# Patient Record
Sex: Female | Born: 1985 | Race: White | Hispanic: No | Marital: Married | Smoking: Current some day smoker
Health system: Southern US, Community
[De-identification: ages and names within clinical notes are randomized; demographics above are authoritative.]

## PROBLEM LIST (undated history)

## (undated) DIAGNOSIS — F32A Depression, unspecified: Secondary | ICD-10-CM

## (undated) DIAGNOSIS — R112 Nausea with vomiting, unspecified: Secondary | ICD-10-CM

## (undated) DIAGNOSIS — F329 Major depressive disorder, single episode, unspecified: Secondary | ICD-10-CM

## (undated) DIAGNOSIS — J45909 Unspecified asthma, uncomplicated: Secondary | ICD-10-CM

## (undated) DIAGNOSIS — F410 Panic disorder [episodic paroxysmal anxiety] without agoraphobia: Secondary | ICD-10-CM

## (undated) DIAGNOSIS — Z9889 Other specified postprocedural states: Secondary | ICD-10-CM

## (undated) DIAGNOSIS — K219 Gastro-esophageal reflux disease without esophagitis: Secondary | ICD-10-CM

## (undated) DIAGNOSIS — R51 Headache: Secondary | ICD-10-CM

## (undated) DIAGNOSIS — H4710 Unspecified papilledema: Secondary | ICD-10-CM

## (undated) DIAGNOSIS — I10 Essential (primary) hypertension: Secondary | ICD-10-CM

## (undated) DIAGNOSIS — F419 Anxiety disorder, unspecified: Secondary | ICD-10-CM

## (undated) DIAGNOSIS — R519 Headache, unspecified: Secondary | ICD-10-CM

## (undated) HISTORY — PX: TUBAL LIGATION: SHX77

## (undated) HISTORY — DX: Unspecified papilledema: H47.10

## (undated) HISTORY — DX: Panic disorder (episodic paroxysmal anxiety): F41.0

## (undated) HISTORY — DX: Depression, unspecified: F32.A

## (undated) HISTORY — DX: Essential (primary) hypertension: I10

## (undated) HISTORY — DX: Major depressive disorder, single episode, unspecified: F32.9

## (undated) HISTORY — DX: Headache: R51

## (undated) HISTORY — DX: Headache, unspecified: R51.9

## (undated) HISTORY — DX: Anxiety disorder, unspecified: F41.9

## (undated) HISTORY — DX: Gastro-esophageal reflux disease without esophagitis: K21.9

---
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
## 2006-04-05 IMAGING — US US OB DETAIL+14 WK
1 series · 13 of 28 positions shown · non-contrast
Comparison: none

CLINICAL DATA: 19 week 1 day assigned gestational age by early ultrasound.  Increased risk for Trisomy 18 by serum screening.

[Series 1: us ob detail+14 wk · 0.37mm/px · 13 of 67 slices shown]
[im 3/67]
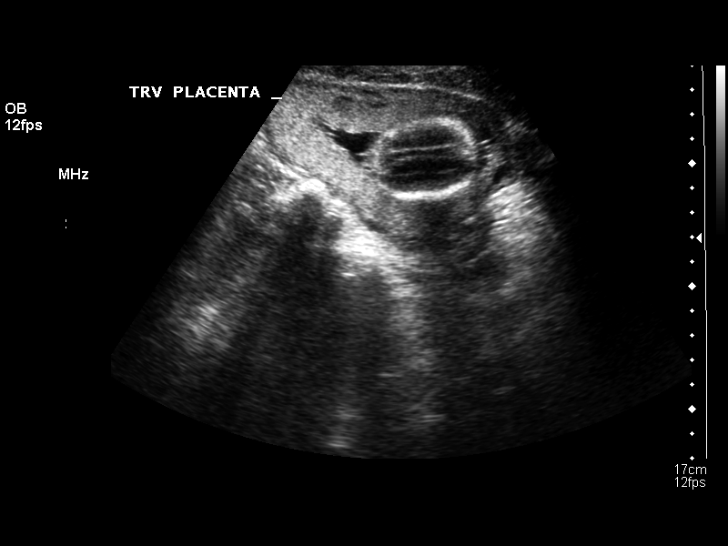
[im 8/67]
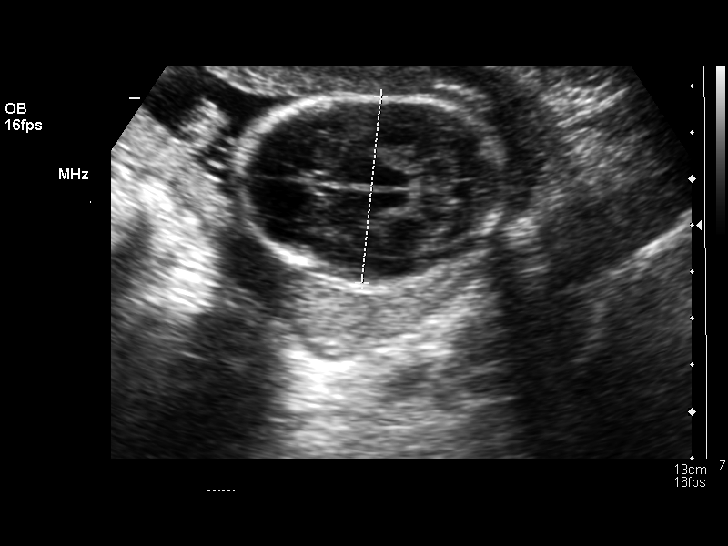
[im 13/67]
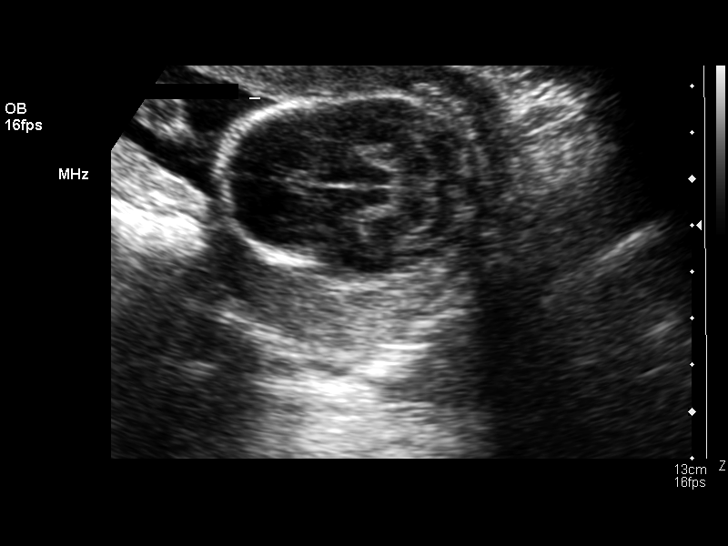
[im 18/67]
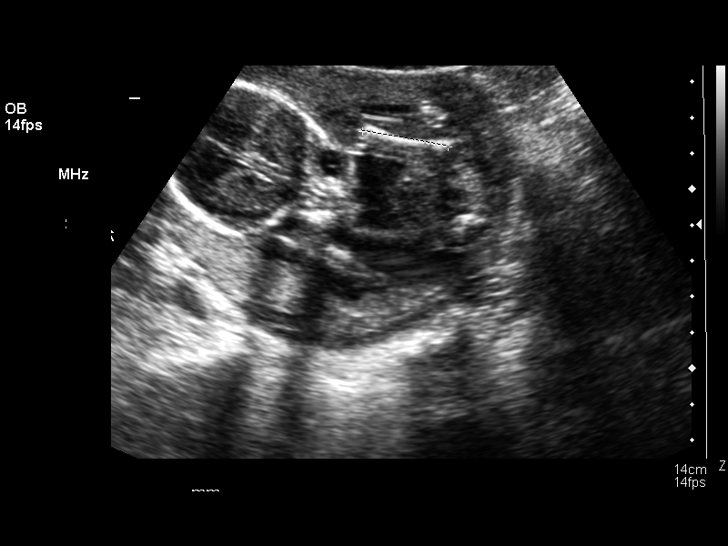
[im 23/67]
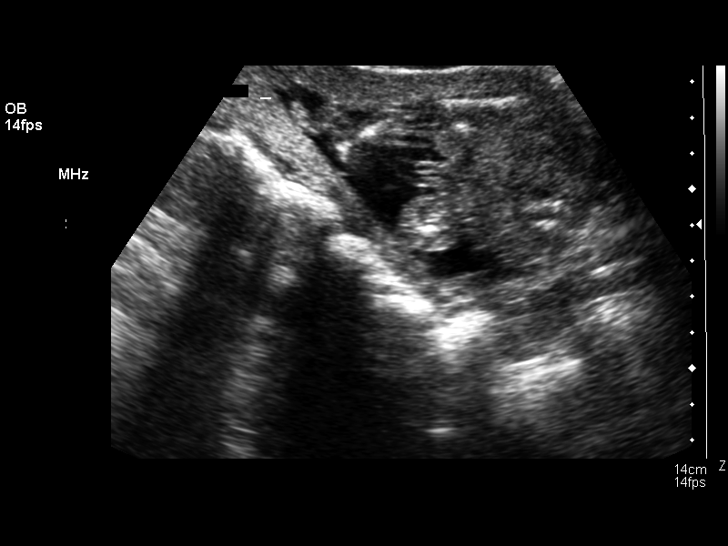
[im 27/67]
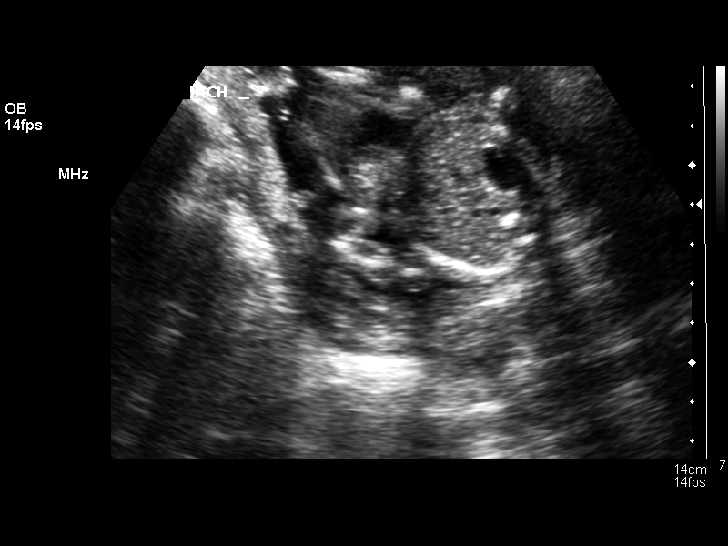
[im 35/67]
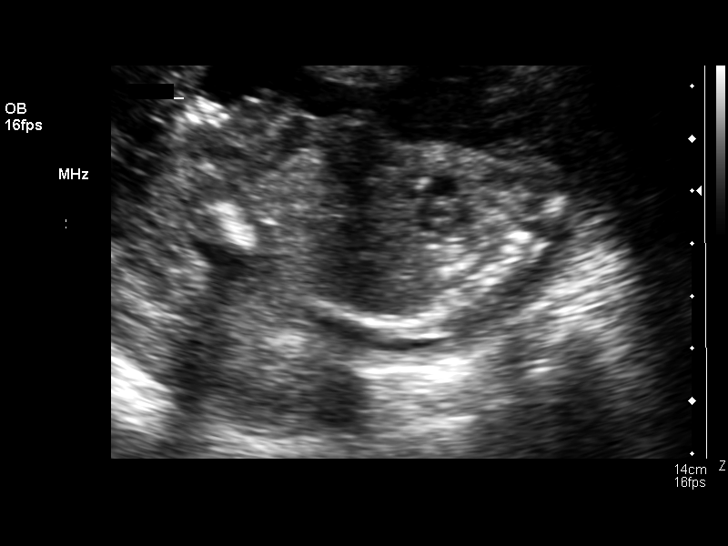
[im 40/67]
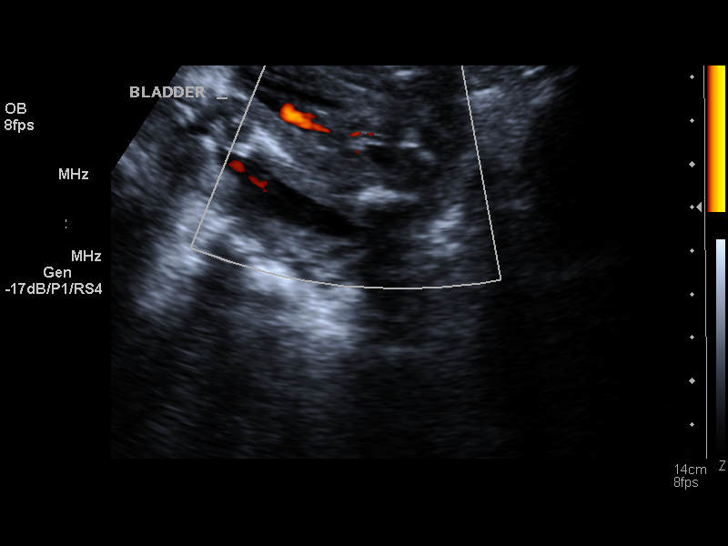
[im 45/67]
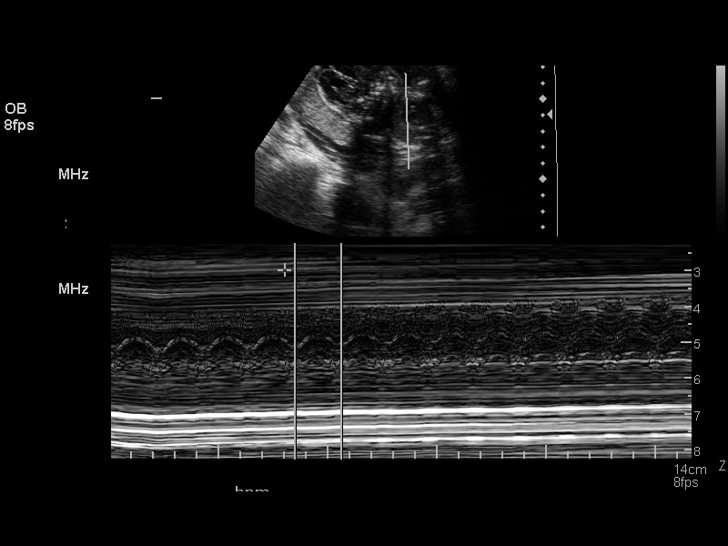
[im 49/67]
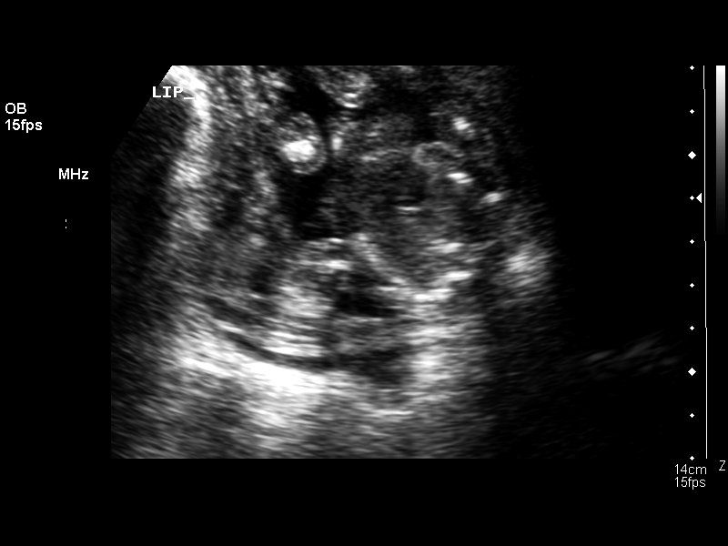
[im 54/67]
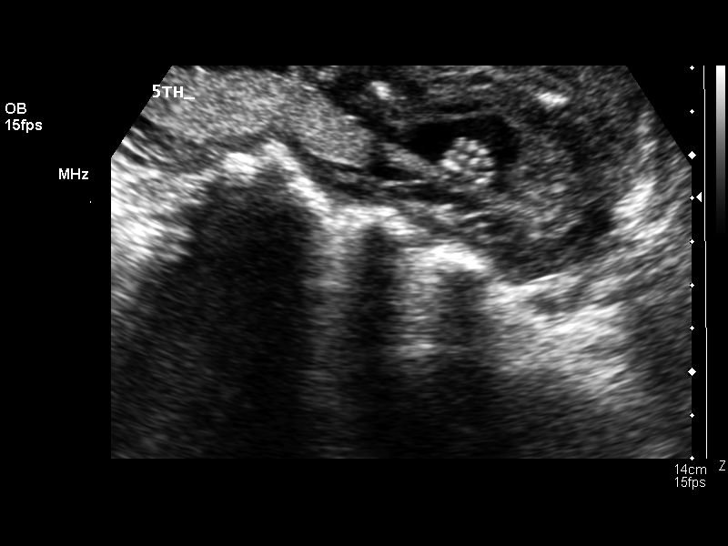
[im 59/67]
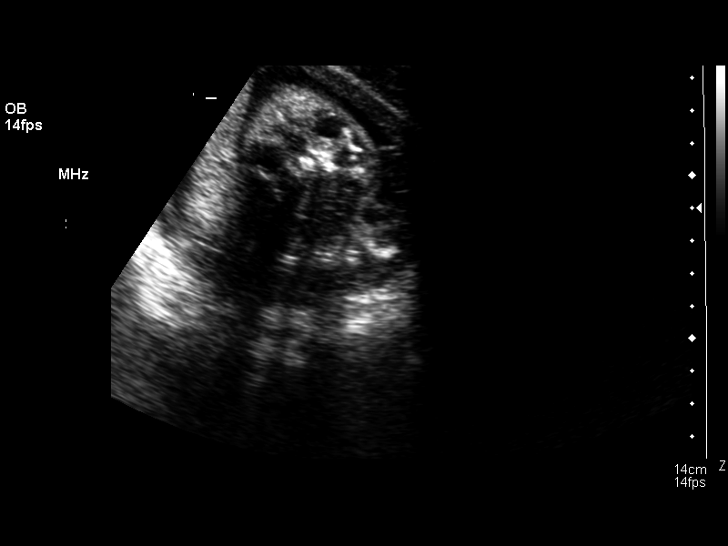
[im 64/67]
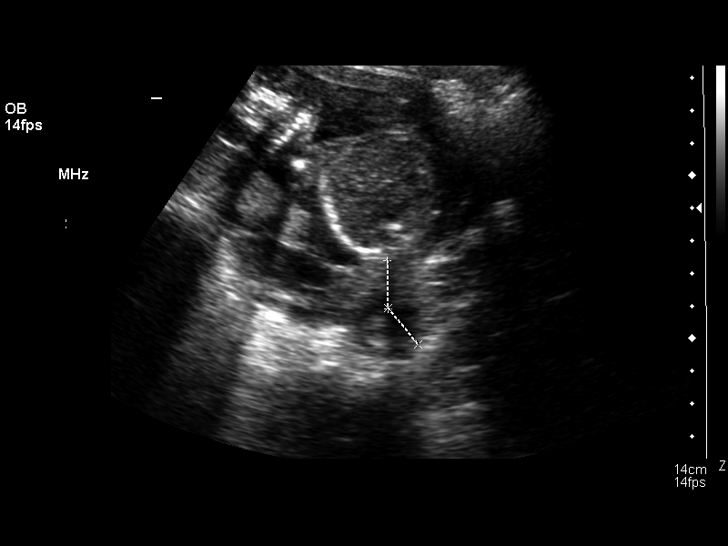

[13 of 28 positions shown; findings below may reference images not displayed]

DETAILED OBSTETRICAL ULTRASOUND:

Number of Fetuses:  1
Heart Rate:  141
Movement:  Yes
Breathing:  No
Presentation:  Breech
Placental Location:  Fundal
Grade:  I
Previa:  No
Amniotic Fluid (Subjective):  Decreased
Amniotic Fluid (Objective):  1.8 cm Vertical pocket 

FETAL BIOMETRY
BPD:  4.0 cm   18 w 2 d
HC:  15.9 cm   18 w 5 d
AC:  13.2 cm  18 w 5 d
FL:  2.5 cm   17 w 3 d
HL:  2.7 cm   18 w 3 d

MEAN GA:  18 w 2 d

FETAL ANATOMY
Lateral Ventricles:  ilated, measuring approximately 1.1 cm. 
Thalami/CSP:  Visualized  
Posterior Fossa:  Visualized 
Nuchal Region:  Visualized 
Spine:  Not visualized 
4 Chamber Heart on Left:  Visualized 
Stomach on Left:  Visualized 
3 Vessel Cord:  Visualized 
Cord Insertion Site:  Visualized 
Kidneys:  Visualized 
Bladder:  Visualized 
Extremities:  Visualized 

ADDITIONAL ANATOMY VISUALIZED:  LVOT, RVOT, upper lip, orbits, diaphragm, heel, and male genitalia.  
Comment:  In addition to dilated lateral cerebral ventricles, an echogenic intracardiac focus is noted within the fetal left ventricle.  A nasal bone is visualized and no other congenital abnormalities are seen.  
Evaluation limited by:  Fetal position and low amniotic fluid volume.  
MATERNAL UTERINE AND ADNEXAL FINDINGS
Cervix:  3.0 cm Transabdominally
IMPRESSION: 1.  Assigned gestational age by prior ultrasound is currently 19 weeks 1 day.  Today?s measurements average 18 weeks 2 day which is approximately 1 week behind and raises the possibility of early IUGR related to aneuploidy.  
2.  Dilated lateral cerebral ventricles noted, as well as echogenic intracardiac focus in the fetal left ventricle.  Decreased amniotic fluid volume also demonstrated.  These findings are nonspecific, but may be seen with Trisomy 18.  Aqueductal stenosis is also a differential consideration.  Amniocentesis is recommended.  This was discussed with the patient and her mother, as well as with Dr. Don Lolito.  

</u12:p>

## 2010-09-07 ENCOUNTER — Encounter: Payer: Self-pay | Admitting: Family Medicine

## 2010-09-08 ENCOUNTER — Encounter: Payer: Self-pay | Admitting: Family Medicine

## 2010-09-08 ENCOUNTER — Encounter: Payer: Self-pay | Admitting: *Deleted

## 2013-11-21 ENCOUNTER — Ambulatory Visit: Payer: Self-pay

## 2013-11-21 ENCOUNTER — Ambulatory Visit: Payer: BC Managed Care – PPO | Attending: Family Medicine

## 2013-11-21 DIAGNOSIS — J45909 Unspecified asthma, uncomplicated: Secondary | ICD-10-CM | POA: Diagnosis not present

## 2013-11-21 DIAGNOSIS — M546 Pain in thoracic spine: Secondary | ICD-10-CM | POA: Insufficient documentation

## 2013-11-21 DIAGNOSIS — R293 Abnormal posture: Secondary | ICD-10-CM | POA: Diagnosis not present

## 2013-11-21 DIAGNOSIS — IMO0001 Reserved for inherently not codable concepts without codable children: Secondary | ICD-10-CM | POA: Diagnosis present

## 2013-11-21 DIAGNOSIS — M6281 Muscle weakness (generalized): Secondary | ICD-10-CM | POA: Insufficient documentation

## 2013-11-23 ENCOUNTER — Ambulatory Visit: Payer: BC Managed Care – PPO | Admitting: Physical Therapy

## 2013-11-23 DIAGNOSIS — IMO0001 Reserved for inherently not codable concepts without codable children: Secondary | ICD-10-CM | POA: Diagnosis not present

## 2013-11-28 ENCOUNTER — Ambulatory Visit: Payer: BC Managed Care – PPO | Admitting: Physical Therapy

## 2013-11-28 DIAGNOSIS — IMO0001 Reserved for inherently not codable concepts without codable children: Secondary | ICD-10-CM | POA: Diagnosis not present

## 2013-11-30 ENCOUNTER — Ambulatory Visit: Payer: BC Managed Care – PPO | Admitting: Physical Therapy

## 2013-11-30 DIAGNOSIS — IMO0001 Reserved for inherently not codable concepts without codable children: Secondary | ICD-10-CM | POA: Diagnosis not present

## 2013-12-05 ENCOUNTER — Ambulatory Visit: Payer: BC Managed Care – PPO | Admitting: Physical Therapy

## 2013-12-05 DIAGNOSIS — IMO0001 Reserved for inherently not codable concepts without codable children: Secondary | ICD-10-CM | POA: Diagnosis not present

## 2013-12-07 ENCOUNTER — Ambulatory Visit: Payer: BC Managed Care – PPO | Admitting: Physical Therapy

## 2013-12-07 DIAGNOSIS — IMO0001 Reserved for inherently not codable concepts without codable children: Secondary | ICD-10-CM | POA: Diagnosis not present

## 2013-12-12 ENCOUNTER — Ambulatory Visit: Payer: BC Managed Care – PPO | Admitting: Physical Therapy

## 2013-12-12 DIAGNOSIS — IMO0001 Reserved for inherently not codable concepts without codable children: Secondary | ICD-10-CM | POA: Diagnosis not present

## 2013-12-14 ENCOUNTER — Ambulatory Visit: Payer: BC Managed Care – PPO | Admitting: Physical Therapy

## 2013-12-14 DIAGNOSIS — IMO0001 Reserved for inherently not codable concepts without codable children: Secondary | ICD-10-CM | POA: Diagnosis not present

## 2014-03-09 ENCOUNTER — Ambulatory Visit (INDEPENDENT_AMBULATORY_CARE_PROVIDER_SITE_OTHER): Payer: BC Managed Care – PPO | Admitting: Neurology

## 2014-03-09 ENCOUNTER — Encounter: Payer: Self-pay | Admitting: Neurology

## 2014-03-09 VITALS — BP 115/78 | HR 73 | Ht <= 58 in | Wt 154.4 lb

## 2014-03-09 DIAGNOSIS — G932 Benign intracranial hypertension: Secondary | ICD-10-CM | POA: Insufficient documentation

## 2014-03-09 DIAGNOSIS — H4710 Unspecified papilledema: Secondary | ICD-10-CM

## 2014-03-09 DIAGNOSIS — E669 Obesity, unspecified: Secondary | ICD-10-CM | POA: Insufficient documentation

## 2014-03-09 DIAGNOSIS — R51 Headache: Secondary | ICD-10-CM

## 2014-03-09 DIAGNOSIS — R519 Headache, unspecified: Secondary | ICD-10-CM | POA: Insufficient documentation

## 2014-03-09 MED ORDER — ACETAZOLAMIDE 125 MG PO TABS: 250.0000 mg | ORAL_TABLET | Freq: Two times a day (BID) | ORAL | Status: DC

## 2014-03-09 MED ORDER — TOPIRAMATE 25 MG PO TABS: 25.0000 mg | ORAL_TABLET | Freq: Every day | ORAL | Status: DC

## 2014-03-09 NOTE — Patient Instructions (Addendum)
I had a long discussion with the patient with regards to her bilateral optic nerve disc swelling and discuss pseudotumor cerebri likely been triggered by her significant recent weight gain and starting birth control pills. I recommend she discontinue birth control pills and diet, exercise, lifestyle change and lose weight. Start Topamax 25 mg twice daily as well. We will not use Diamox because of her allergy to sulfa. Check MRI scan of the brain with MRV and antiphospholipid antibodies, ANA and ESR. Return followup in 2 months or earlier if necessary  Pseudotumor Cerebri Pseudotumor cerebri, also called idiopathic intracranial hypertension, is a condition that occurs due to increased pressure within your skull. Although some of the symptoms resemble those of a brain tumor, it is not a brain tumor. Symptoms occur when the increased pressure in your skull compresses brain structures. For example, pressure on the nerve responsible for vision (optic nerve) causes it to swell, resulting in visual symptoms. Pseudotumor cerebri tends to occur in obese women younger than 27 years of age. However, men and children can also develop this condition. SYMPTOMS  Symptoms of pseudotumor cerebri occur due to increased pressure within the skull. Symptoms may include:   Headaches.  Nausea and vomiting.  Dizziness.  High blood pressure.   Ringing in the ears.  Double or blurred vision.  Brief episodes of complete loss of vision.  Pain in the back, neck, or shoulders. DIAGNOSIS  Pseudotumor cerebri is diagnosed through:  A detailed eye exam, which can reveal a swollen optic nerve, as well as identifying issues such as blind spots in the vision.  An MRI or CT scan to rule out other disorders that can cause similar symptoms, such as brain tumors.  A spinal tap (lumbar puncture), which can demonstrate increased pressure within the skull. TREATMENT  There are several ways that pseudotumor cerebri is  treated, including:  Medicines to decrease the production of spinal fluid and lower the pressure within your skull.  Medicines to prevent or treat headaches.  Surgery to create an opening in your optic nerve to allow excess fluid to drain out.  Surgery to place drains (shunts) in your brain to remove excess fluid. HOME CARE INSTRUCTIONS   Take all medicines as directed by your health care provider.  Go to all of your follow-up appointments.  Lose weight if you are overweight. SEEK MEDICAL CARE IF:  Any symptoms come back.  You develop trouble with hearing, vision, balance, or your sense of smell.  You cannot eat or drink what you need.  You are more weak or tired than usual.   You are losing weight without trying. SEEK IMMEDIATE MEDICAL CARE IF:  You have new symptoms such as vision problems or difficulty walking.   You have a seizure.   You have trouble breathing.   You have a fever.  Document Released: 08/09/2013 Document Reviewed: 08/09/2013 [facility] Patient Information 2015 [place], [place]. This information is not intended to replace advice given to you by your health care provider. Make sure you discuss any questions you have with your health care provider.

## 2014-03-09 NOTE — Progress Notes (Signed)
[facility] [address] [place]. [place] [address] [phone]       OFFICE CONSULT NOTE  Judy Taylor Date of Birth:  1986-02-13 Medical Record Number:  [id number]   Referring MD: Anthony Sar  Reason for Referral:  Papilloedema  HPI: 64 year young lady to notice headache behind her right eye at about 10 days ago. This lasted for about a week and was constant and moderate 7/10 in severity. She took ibuprofen which would releive headache for several hours. There was no accompanying nausea, vomiting and only mild light sensitivity. She did not notice any diminished vision or pain on eye movements. She saw optometrist  Dr. 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 Patient admits to have gained 40 pounds weight last 1 year. She has also been started on birth control pills 6 weeks ago. She denies any double vision, loss of vision, gait balance problems. She has no prior history of migraine headaches or significant neurological problems. 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 She had no tick bite or cat scratch. She is otherwise quite healthy. She is here 2 young children. She is currently a housewife. Chief does not take excess vitamins and started B. Vitamins just a week ago. She is a high school graduate and plans to go to college and study  ROS:   14 system review of systems is positive for  Headache, eye pain, anxiety, insomnia, skin sensitivity and all other systems negative  PMH:  Past Medical History  Diagnosis Date  . Depression     Social History:  History   Social History  . Marital Status: Married    Spouse Name: N/A    Number of Children: 2  . Years of Education: 12th   Occupational History  . N/A    Social History Main Topics  . Smoking status: Current Every Day Smoker  . Smokeless tobacco: Not on file  . Alcohol Use: No  . Drug Use: No  . Sexual Activity: Yes    Other Topics Concern  . Not on file   Social History Narrative   Patient lives at home with her family    Patient is right handed   Patient drinks soda    Medications:   No current outpatient prescriptions on file prior to visit.   No current facility-administered medications on file prior to visit.    Allergies:   Allergies  Allergen Reactions  . Bactrim [Sulfamethoxazole-Tmp Ds] Rash    Physical Exam General: mildly obese young lady, seated, in no evident distress Head: head normocephalic and atraumatic. Orohparynx benign Neck: supple with no carotid or supraclavicular bruits Cardiovascular: regular rate and rhythm, no murmurs Musculoskeletal: no deformity Skin:  no rash/petichiae Vascular:  Normal pulses all extremities Filed Vitals:   03/09/14 0859  BP: 115/78  Pulse: 73    Neurologic Exam Mental Status: Awake and fully alert. Oriented to place and time. Recent and remote memory intact. Attention span, concentration and fund of knowledge appropriate. Mood and affect appropriate.  Cranial Nerves: Fundoscopic exam reveals mild blurred disc margins in nasal inferior and superior quadrants with preserved sharp temporal margins. No peripapillary hemorrhages or exudates are noted. Venous pulsations are sluggish  .vision acuity is normal. Pupils equal, briskly reactive to light. Extraocular movements full without nystagmus. Visual fields full to confrontation. Hearing intact. Facial sensation intact. Face, tongue, palate moves normally and symmetrically.  Motor: Normal bulk and tone. Normal strength in all tested extremity muscles. Sensory.: intact to tough and pinprick and vibratory.  Coordination: Rapid alternating movements normal in all extremities. Finger-to-nose and heel-to-shin performed accurately bilaterally. Gait and Station: Arises from chair without difficulty. Stance is normal. Gait demonstrates normal stride length and balance . Able to heel, toe and tandem  walk without difficulty.  Reflexes: 1+ and symmetric. Toes downgoing.       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 I recommend she discontinue birth control pills and diet, exercise, lifestyle change and lose weight. Start Topamax 25 mg  twice daily as well. We will not use Diamox because of her allergy to sulfa. Check MRI scan of the brain with MRV and antiphospholipid antibodies, ANA and ESR.Since she is not having significant headaches at the present time we'll hold off on spinal tap unless she has worsening vision symptoms or papilledema upon followup. Return followup in 2 months or earlier if necessary  Note: This document was prepared with digital dictation and possible smart phrase technology. Any transcriptional errors that result from this process are unintentional.

## 2014-03-15 LAB — COAG STUDIES INTERP REPORT: PDF Image: 0

## 2014-03-15 LAB — ANTIPHOSPHOLIPID SYNDROME EVAL, BLD
APTT PPP: 28.6 s (ref 23.4–36.4)
Anticardiolipin IgG: <9 GPL U/mL (ref 0–14)
Anticardiolipin IgM: 10 MPL U/mL (ref 0–12)
Beta-2 Glyco 1 IgM: <9 GPI IgM units (ref 0–32)
Beta-2 Glyco I IgG: <9 GPI IgG units (ref 0–20)
Dilute Viper Venom Time: 30.4 s (ref 0.0–55.1)
INR: 0.9 (ref 0.8–1.2)
PT: 12.4 s (ref 11.9–14.1)
Thrombin Time: 14.8 s (ref 0.0–20.0)

## 2014-03-15 LAB — SEDIMENTATION RATE: SED RATE: 11 mm/h (ref 0–32)

## 2014-03-15 LAB — ANA W/REFLEX IF POSITIVE: ANA: NEGATIVE

## 2014-03-16 ENCOUNTER — Telehealth: Payer: Self-pay | Admitting: Neurology

## 2014-03-16 NOTE — Telephone Encounter (Signed)
OV Note says: We will not use Diamox because of her allergy to sulfa. I called the patient back.  Relayed info from OV note.  She verbalized understanding and will call us back if anything further is needed.

## 2014-03-16 NOTE — Telephone Encounter (Signed)
Patient stated Rx for ACETAZOLIDE is too expensive.  Questioning an alternative medication.  Please call anytime and can leave message if not available.  Thanks

## 2014-03-20 ENCOUNTER — Telehealth: Payer: Self-pay | Admitting: Neurology

## 2014-03-20 NOTE — Telephone Encounter (Signed)
Patient calling to request bloodwork results, please return call to patient and advise.

## 2014-03-20 NOTE — Telephone Encounter (Signed)
Called patient and with advise from Dr.Sethi the patient blood work was normal nothing to worry about

## 2014-03-30 ENCOUNTER — Ambulatory Visit (INDEPENDENT_AMBULATORY_CARE_PROVIDER_SITE_OTHER): Payer: BC Managed Care – PPO

## 2014-03-30 DIAGNOSIS — H4710 Unspecified papilledema: Secondary | ICD-10-CM

## 2014-03-30 MED ORDER — GADOPENTETATE DIMEGLUMINE 469.01 MG/ML IV SOLN: 14.0000 mL | Freq: Once | INTRAVENOUS | Status: AC | PRN

## 2014-03-31 ENCOUNTER — Telehealth: Payer: Self-pay | Admitting: Neurology

## 2014-03-31 NOTE — Telephone Encounter (Signed)
Patient calling to request MRI results, please call patient and advise.  °

## 2014-03-31 NOTE — Telephone Encounter (Signed)
Called patient to let her know that it takes at least a week for results to be read,  She just had them done yesrterday.

## 2014-04-03 ENCOUNTER — Telehealth: Payer: Self-pay | Admitting: Neurology

## 2014-04-03 NOTE — Telephone Encounter (Signed)
Patient requesting MRI results, please call back and advise.

## 2014-04-03 NOTE — Telephone Encounter (Signed)
Called patient and lvm letting her know her results are not ready. When there done we will give her a call with results.

## 2014-04-06 ENCOUNTER — Telehealth: Payer: Self-pay | Admitting: *Deleted

## 2014-04-07 NOTE — Telephone Encounter (Signed)
Called patient and gave her normal results for both mri's per Dr. Pearlean BrownieSethi. I also asked her to call if she has any problems or concerns to be seen sooner.

## 2014-06-30 ENCOUNTER — Ambulatory Visit (INDEPENDENT_AMBULATORY_CARE_PROVIDER_SITE_OTHER): Payer: BC Managed Care – PPO | Admitting: Neurology

## 2014-06-30 ENCOUNTER — Encounter: Payer: Self-pay | Admitting: Neurology

## 2014-06-30 VITALS — BP 120/86 | HR 66 | Ht 59.0 in | Wt 151.0 lb

## 2014-06-30 DIAGNOSIS — R519 Headache, unspecified: Secondary | ICD-10-CM

## 2014-06-30 DIAGNOSIS — R51 Headache: Secondary | ICD-10-CM

## 2014-06-30 MED ORDER — TOPIRAMATE 50 MG PO TABS: 50.0000 mg | ORAL_TABLET | Freq: Every day | ORAL | Status: DC

## 2014-06-30 NOTE — Progress Notes (Signed)
[facility] [address] [place]. [place] [address] [phone]       OFFICE FOLLOW UP VISIT NOTE  [name] Date of Birth:  Oct 12, 1985 Medical Record Number:  [id number]   Referring MD: Judy Taylor  Reason for Referral:  Papilloedema  HPI: 24 year young lady to notice headache behind her right eye at about 10 days ago. This lasted for about a week and was constant and moderate 7/10 in severity. She took ibuprofen which would releive headache for several hours. There was no accompanying nausea, vomiting and only mild light sensitivity. She did not notice any diminished vision or pain on eye movements. She saw optometrist  Dr. 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 Patient admits to have gained 40 pounds weight last 1 year. She has also been started on birth control pills 6 weeks ago. She denies any double vision, loss of vision, gait balance problems. She has no prior history of migraine headaches or significant neurological problems. 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 She had no tick bite or cat scratch. She is otherwise quite healthy. She is here 2 young children. She is currently a housewife. Chief does not take excess vitamins and started B. Vitamins just a week ago. She is a high school graduate and plans to go to college and study Update 06/30/2014 : She returns for follow-up after last visit 3 months ago. She reports some improvement in her headaches on Topamax but when he ran out she notices the headaches aren't worse. She is still taking only 25 mg at night and tolerating it well. She had lost about 14 pounds but unfortunately after the death of her grandmother whom she was quite attached to she has gained it all back. She reports a pressure-like sensation behind her eyes with intermittent sharp shooting pain going to the back. This seems to be more increased when she sneezing or  coughing. She has discontinued birth control pills and has now been using copper IUD. She underwent MRI scan of the brain and MRV on 03/30/14 which have both personally reviewed and are normal. Lab work on 03/09/14 shows normal ESR, ANA panel, anticardiolipin antibodies patient denies any blurred vision or loss of vision. She has not yet seen her optometrist for follow-up.she is again today not keen to do a diagnostic spinal tap to confirm diagnosis of pseudotumor cerebri ROS:   14 system review of systems is positive for  Headache, eye pain, anxiety, insomnia, skin sensitivity and all other systems negative  PMH:  Past Medical History  Diagnosis Date  . Depression     Social History:  History   Social History  . Marital Status: Married    Spouse Name: N/A    Number of Children: 2  . Years of Education: 12th   Occupational History  . N/A    Social History Main Topics  . Smoking status: Current Every Day Smoker  . Smokeless tobacco: Never Used  . Alcohol Use: No  . Drug Use: No  . Sexual Activity: Yes   Other Topics Concern  . Not on file   Social History Narrative   Patient is married with 2 children.   Patient is right handed   Patient has hs education.   Patient drinks around 5 sodas daily.    Medications:   Current Outpatient Prescriptions on File Prior to Visit  Medication Sig  Dispense Refill  . LORazepam (ATIVAN) 1 MG tablet Take 1 mg by mouth as needed.     No current facility-administered medications on file prior to visit.    Allergies:   Allergies  Allergen Reactions  . Bactrim [Sulfamethoxazole-Trimethoprim] Rash    Physical Exam General: mildly obese young lady, seated, in no evident distress Head: head normocephalic and atraumatic. Orohparynx benign Neck: supple with no carotid or supraclavicular bruits Cardiovascular: regular rate and rhythm, no murmurs Musculoskeletal: no deformity Skin:  no rash/petichiae Vascular:  Normal pulses all  extremities Filed Vitals:   06/30/14 1026  BP: 120/86  Pulse: 66    Neurologic Exam Mental Status: Awake and fully alert. Oriented to place and time. Recent and remote memory intact. Attention span, concentration and fund of knowledge appropriate. Mood and affect appropriate.  Cranial Nerves: Fundoscopic exam reveals mild blurred disc margins in nasal inferior and superior quadrants with preserved sharp temporal margins. No peripapillary hemorrhages or exudates are noted. Venous pulsations are sluggish  .vision acuity is normal. Pupils equal, briskly reactive to light. Extraocular movements full without nystagmus. Visual fields full to confrontation. Hearing intact. Facial sensation intact. Face, tongue, palate moves normally and symmetrically.  Motor: Normal bulk and tone. Normal strength in all tested extremity muscles. Sensory.: intact to tough and pinprick and vibratory.  Coordination: Rapid alternating movements normal in all extremities. Finger-to-nose and heel-to-shin performed accurately bilaterally. Gait and Station: Arises from chair without difficulty. Stance is normal. Gait demonstrates normal stride length and balance . Able to heel, toe and tandem walk without difficulty.  Reflexes: 1+ and symmetric. Toes downgoing.       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 I recommend she increase her Topamax to 50 mg at night for a week or 2 and if needed increase further to twice daily. I have again counseled her to diet, exercise regularly and lose weight. I also encouraged her to  keep make a follow-up appointment with her optometrist to further categorize her optic disc swelling. She was again strongly counseled to make an appointment to do a diagnostic spinal tap to confirm the diagnosis of pseudotumor. She will think about it and let us know. Note: This document was prepared with digital dictation and possible smart phrase technology. Any transcriptional errors that result from this process are unintentional.

## 2014-06-30 NOTE — Patient Instructions (Signed)
I had a long discussion with the patient with regards to her headache, optic disc swelling and personally reviewed and discussed the findings of MRI scan, MRV and lab results with the patient and answered questions. I recommend she increase her Topamax to 50 mg at night for a week or 2 and if needed increase further to twice daily. I have again counseled her to diet, exercise regularly and lose weight. I also encouraged her to keep make a follow-up appointment with her optometrist to further categorize her optic disc swelling. She was again strongly counseled to make an appointment to do a diagnostic spinal tap to confirm the diagnosis of pseudotumor. She will think about it and let us know.

## 2014-09-27 ENCOUNTER — Telehealth: Payer: Self-pay | Admitting: Neurology

## 2014-09-27 MED ORDER — TOPIRAMATE 50 MG PO TABS: 50.0000 mg | ORAL_TABLET | Freq: Two times a day (BID) | ORAL | Status: DC

## 2014-09-27 NOTE — Telephone Encounter (Signed)
Pt is calling requesting a refill on topiramate (TOPAMAX) 50 MG tablet and it needs to be increased to 100mg .  [facility] [place]  Please call and advise. Pt states she needs to come in for Lumbar puncture but she is waiting on funds to make that appointment.

## 2014-09-27 NOTE — Telephone Encounter (Signed)
Last OV note says: I recommend she increase her Topamax to 50 mg at night for a week or 2 and if needed increase further to twice daily. Rx has been sent.  I called back, got no answer.  Left message.

## 2015-01-03 ENCOUNTER — Telehealth: Payer: Self-pay | Admitting: Neurology

## 2015-01-03 MED ORDER — TOPIRAMATE 50 MG PO TABS: 50.0000 mg | ORAL_TABLET | Freq: Two times a day (BID) | ORAL | Status: DC

## 2015-01-03 NOTE — Telephone Encounter (Signed)
Hi, Judy Taylor: for the topamax, do I need to do anything now? For the LP, did Dr. Pearlean BrownieSethi mention that he wants to do in clinic or he wants to pt to have it under fluoro-guidance? Thanks.  Judy PlanJindong Ronita Hargreaves, MD PhD Stroke Neurology 01/03/2015 2:58 PM

## 2015-01-03 NOTE — Telephone Encounter (Signed)
Rx has been sent.  I called the patient to advise.  She is aware.  

## 2015-01-03 NOTE — Telephone Encounter (Signed)
[facility]  Dr Marlis EdelsonSethi's note does not indicate location for LP if she chose to have it done.

## 2015-01-03 NOTE — Telephone Encounter (Signed)
Because for pseudotumor cerebri, LP has to measure opening pressure. While in fluro-guided LP, they frequently do not check opening pressures, so many neurologists prefer to do it themselves to make sure about the opening pressure issue. I would like to leave this issue to Dr. Pearlean BrownieSethi when he comes back.   Mary, could you please let the pt know that Dr. Pearlean BrownieSethi is out of town and he will contact pt when he comes back. Thanks.   Marvel PlanJindong Shelle Galdamez, MD PhD Stroke Neurology 01/03/2015 3:45 PM

## 2015-01-03 NOTE — Telephone Encounter (Signed)
Patient called requesting a refill for topiramate (TOPAMAX) 50 MG tablet.[facility] [place] [facility] [phone] Patient also requested to be scheduled for a lumbar puncture. Please call and advise. Patient can be reached @ [phone]

## 2015-01-03 NOTE — Telephone Encounter (Signed)
Per Dr Roda ShuttersXu, spoke with patient and informed her that Dr Pearlean BrownieSethi is out of town, returns to this office on 01/10/15. Informed her that Dr Roda ShuttersXu felt Dr Pearlean BrownieSethi should address her request because this procedure is done in this office but may also be done outside this office. She states Dr Pearlean BrownieSethi "told her he would do it in this office." She further stated that she "was fine with waiting until Dr Pearlean BrownieSethi returns to schedule it."  Informed her this request will be routed to Dr Pearlean BrownieSethi for his review when he returns next week. Ms Hart RochesterLawson verbalized understanding, appreciation.

## 2015-01-10 NOTE — Telephone Encounter (Signed)
I called the patient ion her listed number and left message on answering machine to call me back to discuss her headache and LP

## 2015-09-29 ENCOUNTER — Encounter (HOSPITAL_COMMUNITY): Payer: Self-pay | Admitting: *Deleted

## 2015-09-29 DIAGNOSIS — R509 Fever, unspecified: Secondary | ICD-10-CM | POA: Diagnosis present

## 2015-09-29 DIAGNOSIS — J069 Acute upper respiratory infection, unspecified: Secondary | ICD-10-CM | POA: Diagnosis not present

## 2015-09-29 DIAGNOSIS — Z79899 Other long term (current) drug therapy: Secondary | ICD-10-CM | POA: Diagnosis not present

## 2015-09-29 DIAGNOSIS — F17200 Nicotine dependence, unspecified, uncomplicated: Secondary | ICD-10-CM | POA: Diagnosis not present

## 2015-09-29 DIAGNOSIS — J45901 Unspecified asthma with (acute) exacerbation: Secondary | ICD-10-CM | POA: Insufficient documentation

## 2015-09-29 DIAGNOSIS — Z7951 Long term (current) use of inhaled steroids: Secondary | ICD-10-CM | POA: Diagnosis not present

## 2015-09-29 DIAGNOSIS — F329 Major depressive disorder, single episode, unspecified: Secondary | ICD-10-CM | POA: Insufficient documentation

## 2015-09-29 NOTE — ED Notes (Signed)
Pt c/o sore throat, headache, nasal congestion, chills, generalized body aches that started this am,

## 2015-09-30 ENCOUNTER — Emergency Department (HOSPITAL_COMMUNITY)
Admission: EM | Admit: 2015-09-30 | Discharge: 2015-09-30 | Disposition: A | Discharge status: Home / Self Care | Payer: Medicaid Other | Attending: Emergency Medicine | Admitting: Emergency Medicine

## 2015-09-30 ENCOUNTER — Emergency Department (HOSPITAL_COMMUNITY): Payer: Medicaid Other

## 2015-09-30 DIAGNOSIS — J069 Acute upper respiratory infection, unspecified: Secondary | ICD-10-CM

## 2015-09-30 NOTE — ED Notes (Addendum)
Patient wrapped in blanket from home and asleep in waiting room.

## 2015-09-30 NOTE — ED Provider Notes (Signed)
CSN: [id number]     Arrival date & time 09/29/15  2335 History   First MD Initiated Contact with Patient 09/30/15 0119     Chief Complaint  Patient presents with  . Chills     (Consider location/radiation/quality/duration/timing/severity/associated sxs/prior Treatment) HPI  30 year old female presents with a chief complaint of chills. States over the last 2 days she has been having subjective fever with a maximum temperature of 99, chills, cough, congestion, headache, and sore throat. Also having diffuse body aches. The symptoms improved when she takes ibuprofen. Occasionally the cough is productive, she has been taking Mucinex for this. Does feel subjective shortness of breath. No chest pain, abdominal pain, or vomiting. Has had sick contacts with her children who have been at daycare with similar symptoms. Received a flu shot in October. Diagnosed with "mild asthma" that she states has been told is mostly anxiety driven.  Past Medical History  Diagnosis Date  . Depression    Past Surgical History  Procedure Laterality Date  . Cesarean section     Family History  Problem Relation Age of Onset  . High blood pressure Mother   . Heart attack Maternal Grandmother   . Dementia Paternal Grandmother    Social History  Substance Use Topics  . Smoking status: Current Every Day Smoker  . Smokeless tobacco: Never Used  . Alcohol Use: No   OB History    No data available     Review of Systems  Constitutional: Positive for fever and chills.  Respiratory: Positive for cough and shortness of breath.   Cardiovascular: Negative for chest pain.  Gastrointestinal: Negative for vomiting and abdominal pain.  Musculoskeletal: Positive for myalgias. Negative for joint swelling.  All other systems reviewed and are negative.     Allergies  Bactrim  Home Medications   Prior to Admission medications   Medication Sig Start Date End Date Taking? Authorizing Provider  clonazePAM (KLONOPIN)  0.5 MG tablet Take 0.5 mg by mouth 2 (two) times daily.   Yes Historical Provider, MD  fluticasone (FLONASE) 50 MCG/ACT nasal spray Place 1 spray into both nostrils as needed.  05/12/14  Yes Historical Provider, MD  lamoTRIgine (LAMICTAL) 200 MG tablet Take 200 mg by mouth 2 (two) times daily.   Yes Historical Provider, MD  lurasidone (LATUDA) 20 MG TABS tablet Take 20 mg by mouth.   Yes Historical Provider, MD  LORazepam (ATIVAN) 1 MG tablet Take 1 mg by mouth as needed. 01/12/14   Historical Provider, MD  topiramate (TOPAMAX) 50 MG tablet Take 1 tablet (50 mg total) by mouth 2 (two) times daily. 01/03/15 01/03/16  Micki Riley, MD   BP 128/79 mmHg  Pulse 94  Temp(Src) 99 F (37.2 C) (Oral)  Resp 20  Ht  (1.473 m)  Wt 166 lb (75.297 kg)  BMI 34.70 kg/m2  SpO2 100%  LMP 08/29/2015 Physical Exam  Constitutional: She is oriented to person, place, and time. She appears well-developed and well-nourished.  HENT:  Head: Normocephalic and atraumatic.  Right Ear: External ear normal.  Left Ear: External ear normal.  Nose: Nose normal.  Mouth/Throat: Oropharynx is clear and moist. No oropharyngeal exudate.  Eyes: Right eye exhibits no discharge. Left eye exhibits no discharge.  Neck: Normal range of motion. Neck supple.  No meningismus  Cardiovascular: Normal rate, regular rhythm and normal heart sounds.   Pulmonary/Chest: Effort normal [id number] sounds normal. She has no wheezes. She has no rales.  Abdominal: Soft. There is  no tenderness.  Lymphadenopathy:    She has no cervical adenopathy.  Neurological: She is alert and oriented to person, place, and time.  Skin: Skin is warm and dry.  Nursing note and vitals reviewed.   ED Course  Procedures (including critical care time) Labs Review Labs Reviewed - No data to display  Imaging Review Dg Chest 2 View  09/30/2015  CLINICAL DATA:  Acute onset of sore throat, headache and nasal congestion. Chills and body aches. Initial  encounter. EXAM: CHEST  2 VIEW COMPARISON:  None. FINDINGS: The lungs are well-aerated. Mild vascular congestion is noted. There is no evidence of focal opacification, pleural effusion or pneumothorax. The heart is borderline normal in size. No acute osseous abnormalities are seen. IMPRESSION: Mild vascular congestion noted.  Lungs remain grossly clear. Electronically Signed   By: Roanna Raider M.D.   On: 09/30/2015 02:12   I have personally reviewed and evaluated these images and lab results as part of my medical decision-making.   EKG Interpretation None      MDM   Final diagnoses:  Upper respiratory infection    Patient symptoms are consistent with a viral upper restaurant infection. Discussed that this could be influenza but after discussion with patient she would not want to be treated with Tamiflu. She does have a reported history of "mild asthma" but she has not used an inhaler for quite some time and I don't think this is a significant risk factor for possible flu. Plan to treat with continued ibuprofen and Tylenol as well as oral fluids and antitussives. Discussed strict return precautions. Follow-up with PCP.    Pricilla Loveless, MD 09/30/15 [phone]

## 2015-11-20 ENCOUNTER — Telehealth: Payer: Self-pay | Admitting: Neurology

## 2015-11-20 NOTE — Telephone Encounter (Signed)
Rn call patient back wanting a LP. Rn explain according to [facility] notes she was last here in 06/2014. Pt stated she got her dates mix up. Rn explain Dr.Sethi cannot order a LP until he evaluates her. Patient is seeing the eye doctor. Pt states she is in school full time. Pt schedule for May 2017 for appt. Pt verbalized understanding.

## 2015-11-20 NOTE — Telephone Encounter (Signed)
Pt was seen May of 2016 and would like to set up an LP now. It was last discussed during May visit. Please call and advise [phone] cell

## 2015-12-26 ENCOUNTER — Ambulatory Visit: Payer: Self-pay | Admitting: Neurology

## 2015-12-27 ENCOUNTER — Encounter: Payer: Self-pay | Admitting: Neurology

## 2016-03-20 ENCOUNTER — Encounter: Payer: Self-pay | Admitting: Neurology

## 2016-03-20 ENCOUNTER — Ambulatory Visit (INDEPENDENT_AMBULATORY_CARE_PROVIDER_SITE_OTHER): Payer: Medicaid Other | Admitting: Neurology

## 2016-03-20 VITALS — BP 131/84 | HR 97 | Ht <= 58 in | Wt 171.0 lb

## 2016-03-20 DIAGNOSIS — G932 Benign intracranial hypertension: Secondary | ICD-10-CM

## 2016-03-20 MED ORDER — TOPIRAMATE 25 MG PO TABS: 25.0000 mg | ORAL_TABLET | Freq: Two times a day (BID) | ORAL | 3 refills | Status: DC

## 2016-03-20 MED ORDER — TOPIRAMATE 50 MG PO TABS: 50.0000 mg | ORAL_TABLET | Freq: Two times a day (BID) | ORAL | 2 refills | Status: DC

## 2016-03-20 NOTE — Progress Notes (Signed)
[facility] [address] [place] [place] [address] [phone]       OFFICE FOLLOW UP VISIT NOTE  [name] Date of Birth:  [birth year] Medical Record Number:  [id number]   Referring MD: Anthony Sar  Reason for Referral:  Papilloedema  HPI: 12 year young lady to notice headache behind her right eye at about 10 days ago. This lasted for about a week and was constant and moderate 7/10 in severity. She took ibuprofen which would releive headache for several hours. There was no accompanying nausea, vomiting and only mild light sensitivity. She did not notice any diminished vision or pain on eye movements. She saw optometrist  Dr. 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 Patient admits to have gained 40 pounds weight last 1 year. She has also been started on birth control pills 6 weeks ago. She denies any double vision, loss of vision, gait balance problems. She has no prior history of migraine headaches or significant neurological problems. 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 She had no tick bite or cat scratch. She is otherwise quite healthy. She is here 2 young children. She is currently a housewife. Chief does not take excess vitamins and started B. Vitamins just a week ago. She is a high school graduate and plans to go to college and study Update 06/30/2014 : She returns for follow-up after last visit 3 months ago. She reports some improvement in her headaches on Topamax but when he ran out she notices the headaches aren't worse. She is still taking only 25 mg at night and tolerating it well. She had lost about 14 pounds but unfortunately after the death of her grandmother whom she was quite attached to she has gained it all back. She reports a pressure-like sensation behind her eyes with intermittent sharp shooting pain going to the back. This seems to be more increased when she sneezing or  coughing. She has discontinued birth control pills and has now been using copper IUD. She underwent MRI scan of the brain and MRV on 03/30/14 which have both personally reviewed and are normal. Lab work on 03/09/14 shows normal ESR, ANA panel, anticardiolipin antibodies patient denies any blurred vision or loss of vision. She has not yet seen her optometrist for follow-up.she is again today not keen to do a diagnostic spinal tap to confirm diagnosis of pseudotumor cerebri Update 03/20/2016 :  Judy Taylor is seen today for follow-up after last visit more than a year and half ago. Patient states that she was doing better and Topamax was helping her eye pain and headaches but her psychiatrist stopped the Topamax 8 months ago after starting Effexor. The patient does not report significant worsening of a headache or vision difficulties. She was last seen by optometrist on 01/02/16 who noted that the optic disc swelling is about the same and not worse. I do not have those records to review today. Patient is still not keen to get a diagnostic spinal tap done to confirm the diagnosis of pseudotumor. She has also not lost significant weight but plans to do so soon. She has no new complaints. ROS:   14 system review of systems is positive for  Headache, eye pain, frequent waking, depression and all other systems negative  PMH:  Past Medical History:  Diagnosis Date  . Depression   . Headache   . Hypertension  Social History:  Social History   Social History  . Marital status: Divorced    Spouse name: N/A  . Number of children: 2  . Years of education: 12th   Occupational History  . N/A    Social History Main Topics  . Smoking status: Current Every Day Smoker  . Smokeless tobacco: Never Used  . Alcohol use No  . Drug use: No  . Sexual activity: Yes   Other Topics Concern  . Not on file   Social History Narrative   Patient is married with 2 children.   Patient is right handed   Patient has hs  education.   Patient drinks around 5 sodas daily.    Medications:   Current Outpatient Prescriptions on File Prior to Visit  Medication Sig Dispense Refill  . clonazePAM (KLONOPIN) 0.5 MG tablet Take 0.5 mg by mouth 2 (two) times daily.    . fluticasone (FLONASE) 50 MCG/ACT nasal spray Place 1 spray into both nostrils as needed.   0  . lamoTRIgine (LAMICTAL) 200 MG tablet Take 200 mg by mouth 2 (two) times daily.    Marland Kitchen LORazepam (ATIVAN) 1 MG tablet Take 1 mg by mouth as needed.    . lurasidone (LATUDA) 20 MG TABS tablet Take 20 mg by mouth.     No current facility-administered medications on file prior to visit.     Allergies:   Allergies  Allergen Reactions  . Bactrim [Sulfamethoxazole-Trimethoprim] Rash    Physical Exam General: mildly obese young lady, seated, in no evident distress Head: head normocephalic and atraumatic.     Neck: supple with no carotid or supraclavicular bruits Cardiovascular: regular rate and rhythm, no murmurs Musculoskeletal: no deformity Skin:  no rash/petichiae Vascular:  Normal pulses all extremities Vitals:   03/20/16 1101  BP: 131/84  Pulse: 97    Neurologic Exam Mental Status: Awake and fully alert. Oriented to place and time. Recent and remote memory intact. Attention span, concentration and fund of knowledge appropriate. Mood and affect appropriate.  Cranial Nerves: Fundoscopic exam reveals mild blurred disc margins in nasal inferior and superior quadrants with preserved sharp temporal margins. No peripapillary hemorrhages or exudates are noted. Venous pulsations are sluggish  .vision acuity is normal. Pupils equal, briskly reactive to light. Extraocular movements full without nystagmus. Visual fields full to confrontation. Hearing intact. Facial sensation intact. Face, tongue, palate moves normally and symmetrically.  Motor: Normal bulk and tone. Normal strength in all tested extremity muscles. Sensory.: intact to tough and pinprick and  vibratory.  Coordination: Rapid alternating movements normal in all extremities. Finger-to-nose and heel-to-shin performed accurately bilaterally. Gait and Station: Arises from chair without difficulty. Stance is normal. Gait demonstrates normal stride length and balance . Able to heel, toe and tandem walk without difficulty.  Reflexes: 1+ and symmetric. Toes downgoing.       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 Patient again seems quite reluctant to do the spinal tap. I encouraged her to diet, exercise and lose weight and she is willing. I will reorder Topamax 50 mg twice daily but advised her to discuss this with her psychiatrist again since they had discontinued it  in the past. Patient was advised to keep regular follow-up with the optometrist to have funduscopic exam and blind spot charting 6 monthly. She understands that that if she has worsening headaches or blurred vision she will return for a spinal tap. Greater than 50% time during this 25 minute visit was spent on counseling and coordination of care about pseudotumor, headache and vision loss on answering questions  Antony Contras, MD  [facility] [address] [place], [place] [address]  Phone [phone] Fax [phone]. Note: This document was prepared with digital dictation and possible smart phrase technology. Any transcriptional errors that result from this process are unintentional.

## 2016-03-20 NOTE — Patient Instructions (Signed)
I had a long discussion with the patient with regards to her diagnosis of pseudotumor and bilateral papilledema and need for diagnostic spinal tap to confirm the diagnosis. Patient again seems quite reluctant to do the spinal tap. I encouraged her to diet, exercise and lose weight and she is willing. I will reorder Topamax 50 mg twice daily but advised her to discuss this with her psychiatrist again since they had discontinued it in the past. Patient was advised to keep regular follow-up with the optometrist to have funduscopic exam and blind spot charting 6 monthly. She understands that that if she has worsening headaches or blurred vision she will return for a spinal tap  Pseudotumor Cerebri Pseudotumor cerebri, also called idiopathic intracranial hypertension, is a condition that occurs due to increased pressure within your skull. Although some of the symptoms resemble those of a brain tumor, it is not a brain tumor. Symptoms occur when the increased pressure in your skull compresses brain structures. For example, pressure on the nerve responsible for vision (optic nerve) causes it to swell, resulting in visual symptoms. Pseudotumor cerebri tends to occur in obese women younger than 29 years of age. However, men and children can also develop this condition. SYMPTOMS  Symptoms of pseudotumor cerebri occur due to increased pressure within the skull. Symptoms may include:   Headaches.  Nausea and vomiting.  Dizziness.  High blood pressure.   Ringing in the ears.  Double or blurred vision.  Brief episodes of complete loss of vision.  Pain in the back, neck, or shoulders. DIAGNOSIS  Pseudotumor cerebri is diagnosed through:  A detailed eye exam, which can reveal a swollen optic nerve, as well as identifying issues such as blind spots in the vision.  An MRI or CT scan to rule out other disorders that can cause similar symptoms, such as brain tumors.  A spinal tap (lumbar puncture), which  can demonstrate increased pressure within the skull. TREATMENT  There are several ways that pseudotumor cerebri is treated, including:  Medicines to decrease the production of spinal fluid and lower the pressure within your skull.  Medicines to prevent or treat headaches.  Surgery to create an opening in your optic nerve to allow excess fluid to drain out.  Surgery to place drains (shunts) in your brain to remove excess fluid. HOME CARE INSTRUCTIONS   Take all medicines as directed by your health care provider.  Go to all of your follow-up appointments.  Lose weight if you are overweight. SEEK MEDICAL CARE IF:  Any symptoms come back.  You develop trouble with hearing, vision, balance, or your sense of smell.  You cannot eat or drink what you need.  You are more weak or tired than usual.   You are losing weight without trying. SEEK IMMEDIATE MEDICAL CARE IF:  You have new symptoms such as vision problems or difficulty walking.   You have a seizure.   You have trouble breathing.   You have a fever.    This information is not intended to replace advice given to you by your health care provider. Make sure you discuss any questions you have with your health care provider.   Document Released: 08/09/2013 Document Reviewed: 08/09/2013 Elsevier Interactive Patient Education [employer]

## 2016-04-30 ENCOUNTER — Encounter (HOSPITAL_COMMUNITY): Payer: Self-pay

## 2016-04-30 ENCOUNTER — Emergency Department (HOSPITAL_COMMUNITY)
Admission: EM | Admit: 2016-04-30 | Discharge: 2016-05-01 | Disposition: A | Discharge status: Home / Self Care | Payer: Medicaid Other | Attending: Dermatology | Admitting: Dermatology

## 2016-04-30 DIAGNOSIS — I10 Essential (primary) hypertension: Secondary | ICD-10-CM | POA: Diagnosis not present

## 2016-04-30 DIAGNOSIS — F17210 Nicotine dependence, cigarettes, uncomplicated: Secondary | ICD-10-CM | POA: Insufficient documentation

## 2016-04-30 DIAGNOSIS — F329 Major depressive disorder, single episode, unspecified: Secondary | ICD-10-CM | POA: Diagnosis present

## 2016-04-30 DIAGNOSIS — Z5321 Procedure and treatment not carried out due to patient leaving prior to being seen by health care provider: Secondary | ICD-10-CM | POA: Diagnosis not present

## 2016-04-30 NOTE — ED Notes (Signed)
Pt was called to be taken back to a room, no response.
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
## 2016-04-30 NOTE — ED Notes (Signed)
[facility] Pt's did not answer to name being called.

## 2016-04-30 NOTE — ED Notes (Signed)
Triaged called for report. States they will call for patient.

## 2016-10-30 ENCOUNTER — Encounter (HOSPITAL_COMMUNITY): Payer: Self-pay

## 2016-10-30 ENCOUNTER — Emergency Department (HOSPITAL_COMMUNITY): Payer: Medicaid Other

## 2016-10-30 ENCOUNTER — Emergency Department (HOSPITAL_COMMUNITY)
Admission: EM | Admit: 2016-10-30 | Discharge: 2016-10-31 | Disposition: A | Discharge status: Home / Self Care | Payer: Medicaid Other | Attending: Emergency Medicine | Admitting: Emergency Medicine

## 2016-10-30 DIAGNOSIS — I10 Essential (primary) hypertension: Secondary | ICD-10-CM | POA: Insufficient documentation

## 2016-10-30 DIAGNOSIS — F17210 Nicotine dependence, cigarettes, uncomplicated: Secondary | ICD-10-CM | POA: Diagnosis not present

## 2016-10-30 DIAGNOSIS — R140 Abdominal distension (gaseous): Secondary | ICD-10-CM

## 2016-10-30 DIAGNOSIS — Z79899 Other long term (current) drug therapy: Secondary | ICD-10-CM | POA: Insufficient documentation

## 2016-10-30 DIAGNOSIS — R109 Unspecified abdominal pain: Secondary | ICD-10-CM | POA: Diagnosis present

## 2016-10-30 LAB — POC URINE PREG, ED: Preg Test, Ur: NEGATIVE

## 2016-10-30 NOTE — ED Notes (Signed)
States she has had multiple psychiatric medication changes.

## 2016-10-30 NOTE — ED Triage Notes (Signed)
Around 1500 today I was cleaning and I felt something in my abdomen like a fluttering.  It feels like it felt when I was pregnant.  I have an IUD.  I took a pregnancy test and it was negative.  I talked to my sister who is a Engineer, civil (consulting)nurse and she recommended that I come.  Denies nausea, vomiting, or diarrhea.

## 2016-10-30 NOTE — ED Notes (Signed)
Pt reports she has had the copper IUD in for 4-5 years, LMP was around Christmas. States for several weeks has had "fluttering" in her abd and has had N/V/ for several months.

## 2016-10-31 NOTE — ED Provider Notes (Signed)
[facility]-EMER[facility]ENCY DEPT Provider Note   CSN: [id number] Arrival date & time: 10/30/16  2135     History   Chief Complaint Chief Complaint  Patient presents with  . Other    HPI Judy Taylor is a 31 y.o. female.  The history is provided by the patient. No language interpreter was used.  Abdominal Cramping  This is a new problem. The current episode started more than 1 week ago. The problem occurs constantly. The problem has been rapidly worsening. Associated symptoms include abdominal pain. Nothing relieves the symptoms. She has tried nothing for the symptoms. The treatment provided no relief.  Pt reports she feels something moving in her right lower abdomen.  Pt had a negative pregnancy test.  Pt reports she has been having some nausea. Pt is concerned that she could be pregnant.  Pt reports feels like when she was pregnant and baby moved.   Past Medical History:  Diagnosis Date  . Depression   . Headache   . Hypertension     Patient Active Problem List   Diagnosis Date Noted  . 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 Pseudotumor cerebri 03/09/2014  . Obesity, 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/09/2014  . Headache(784.0) 03/09/2014    Past Surgical History:  Procedure Laterality Date  . CESAREAN SECTION      OB History    No data available       Home Medications    Prior to Admission medications   Medication Sig Start Date End Date Taking? Authorizing Provider  clonazePAM (KLONOPIN) 0.5 MG tablet Take 0.5 mg by mouth 2 (two) times daily.    Historical Provider, MD  fluticasone (FLONASE) 50 MCG/ACT nasal spray Place 1 spray into both nostrils as needed.  05/12/14   Historical Provider, MD  lamoTRIgine (LAMICTAL) 200 MG tablet Take 200 mg by mouth 2 (two) times daily.    Historical Provider, MD  LORazepam (ATIVAN) 1 MG tablet Take 1 mg by mouth as needed. 01/12/14   Historical Provider, MD  lurasidone (LATUDA) 20 MG TABS tablet Take 20 mg by mouth.    Historical Provider, MD    topiramate (TOPAMAX) 25 MG tablet Take 1 tablet (25 mg total) by mouth 2 (two) times daily. 03/20/16   Micki Riley, MD  topiramate (TOPAMAX) 50 MG tablet Take 1 tablet (50 mg total) by mouth 2 (two) times daily. 03/20/16 03/20/17  Micki Riley, MD    Family History Family History  Problem Relation Age of Onset  . High blood pressure Mother   . Heart attack Maternal Grandmother   . Dementia Paternal Grandmother     Social History Social History  Substance Use Topics  . Smoking status: Current Every Day Smoker    Packs/day: 1.00    Types: Cigarettes  . Smokeless tobacco: Never Used  . Alcohol use No     Allergies   Bactrim [sulfamethoxazole-trimethoprim]   Review of Systems Review of Systems  Gastrointestinal: Positive for abdominal pain.  All other systems reviewed and are negative.    Physical Exam Updated Vital Signs BP (!) 141/93 (BP Location: Right Arm)   Pulse (!) 102   Temp 98.9 F (37.2 C) (Oral)   Resp 16   Ht 4\' 10"  (1.473 m)   Wt 75.3 kg   SpO2 98%   BMI 34.69 kg/m   Physical Exam  Constitutional: She appears well-developed and well-nourished. No distress.  HENT:  Head: Normocephalic and atraumatic.  Eyes: Conjunctivae are normal.  Neck: Neck supple.  Cardiovascular:  Normal rate and regular rhythm.   No murmur heard. Pulmonary/Chest: Effort normal and breath sounds normal. No respiratory distress.  Abdominal: Soft. She exhibits no mass. There is no guarding.  Musculoskeletal: She exhibits no edema.  Neurological: She is alert.  Skin: Skin is warm and dry.  Psychiatric: She has a normal mood and affect.  Nursing note and vitals reviewed.    ED Treatments / Results  Labs (all labs ordered are listed, but only abnormal results are displayed) Labs Reviewed  POC URINE PREG, ED    EKG  EKG Interpretation None       Radiology Dg Abdomen 1 View  Result Date: 10/31/2016 CLINICAL DATA:  Felt something moving in the abdomen EXAM:  ABDOMEN - 1 VIEW COMPARISON:  None. FINDINGS: Nonobstructed bowel-gas pattern. No abnormal calcification. Intrauterine device in the pelvis. Calcified right pelvic phleboliths. IMPRESSION: Nonobstructed bowel-gas pattern Electronically Signed   By: Jasmine PangKim  Fujinaga M.D.   On: 10/31/2016 00:10    Procedures Procedures (including critical care time)  Medications Ordered in ED Medications - No data to display   Initial Impression / Assessment and Plan / ED Course  I have reviewed the triage vital signs and the nursing notes.  Pertinent labs & imaging results that were available during my care of the patient were reviewed by me and considered in my medical decision making (see chart for details).     Pt has some increased stool right side.  Pt encouraged to increase water and fiber.  Suspect sensation is from bowel/gas  test is negative.  I   Final Clinical Impressions(s) / ED Diagnoses   Final diagnoses:  Abdominal bloating    New Prescriptions New Prescriptions   No medications on file  An After Visit Summary was printed and given to the patient.    Lonia SkinnerLeslie K CusterSofia, PA-C 10/31/16 [id number]    Linwood DibblesJon Knapp, MD 11/03/16 [phone]

## 2016-10-31 NOTE — Discharge Instructions (Signed)
Return if any problems.  Increase fiber in your diet

## 2018-04-27 ENCOUNTER — Encounter: Payer: Self-pay | Admitting: Neurology

## 2018-04-27 ENCOUNTER — Ambulatory Visit: Payer: Medicaid Other | Admitting: Neurology

## 2018-04-27 VITALS — BP 105/75 | HR 74 | Ht <= 58 in | Wt 178.8 lb

## 2018-04-27 DIAGNOSIS — G932 Benign intracranial hypertension: Secondary | ICD-10-CM | POA: Diagnosis not present

## 2018-04-27 MED ORDER — TOPIRAMATE 25 MG PO TABS: 25.0000 mg | ORAL_TABLET | Freq: Two times a day (BID) | ORAL | 3 refills | Status: DC

## 2018-04-27 NOTE — Patient Instructions (Signed)
I had a long discussion with the patient regarding her eye pain and pseudotumor and encouraged patient to diet and lose weight. Discussed importance of weight loss in this condition. The patient had good response to Topamax in the past and wants to go back on it. I recommend she start topamax 25 mg twice daily but I advised her to discuss this with her psychiatrist as apparently it had been stopped and then. I also encouraged her to continue regular follow-ups with the ophthalmologist to have her visual acuity and blind spot testing.She will return for follow-up in 2 months with Meghan my nurse practitioner  t or call earlier if necessary  Idiopathic Intracranial Hypertension Idiopathic intracranial hypertension (IIH) is a condition that increases pressure around the brain. The fluid that surrounds the brain and spinal cord (cerebrospinal fluid, CSF) increases and causes the pressure. Idiopathic means that the cause of this condition is not known. IIH affects the brain and spinal cord (is a neurological disorder). If this condition is not treated, it can cause vision loss or blindness. What increases the risk? You are more likely to develop this condition if:  You are severely overweight (obese).  You are a woman who has not gone through menopause.  You take certain medicines, such as birth control or steroids.  What are the signs or symptoms? Symptoms of IIH include:  Headaches. This is the most common symptom.  Pain in the shoulders or neck.  Nausea and vomiting.  A "rushing water" or pulsing sound within the ears (pulsatile tinnitus).  Double vision.  Blurred vision.  Brief episodes of complete vision loss.  How is this diagnosed? This condition may be diagnosed based on:  Your symptoms.  Your medical history.  CT scan of the brain.  MRI of the brain.  Magnetic resonance venogram (MRV) to check veins in the brain.  Diagnostic lumbar puncture. This is a procedure to  remove and examine a sample of cerebrospinal fluid. This procedure can determine whether too much fluid may be causing IIH.  A thorough eye exam to check for swelling or nerve damage in the eyes.  How is this treated? Treatment for this condition depends on your symptoms. The goal of treatment is to decrease the pressure around your brain. Common treatments include:  Medicines to decrease the production of spinal fluid and lower the pressure within your skull.  Medicines to prevent or treat headaches.  Surgery to place drains (shunts) in your brain to remove excess fluid.  Lumbar puncture to remove excess cerebrospinal fluid.  Follow these instructions at home:  If you are overweight or obese, work with your health care provider to lose weight.  Take over-the-counter and prescription medicines only as told by your health care provider.  Do not drive or use heavy machinery while taking medicines that can make you sleepy.  Keep all follow-up visits as told by your health care provider. This is important. Contact a health care provider if:  You have changes in your vision, such as: ? Double vision. ? Not being able to see colors (color vision). Get help right away if:  You have any of the following symptoms and they get worse or do not get better. ? Headaches. ? Nausea. ? Vomiting. ? Vision changes or difficulty seeing. Summary  Idiopathic intracranial hypertension (IIH) is a condition that increases pressure around the brain. The cause is not known (is idiopathic).  The most common symptom of IIH is headaches.  Treatment may include medicines  or surgery to relieve the pressure on your brain. This information is not intended to replace advice given to you by your health care provider. Make sure you discuss any questions you have with your health care provider. Document Released: 10/13/2001 Document Revised: 06/25/2016 Document Reviewed: 06/25/2016 Elsevier Interactive  Patient Education  2017 [employer].

## 2018-04-27 NOTE — Progress Notes (Signed)
[facility] OFFICE FOLLOW UP VISIT NOTE  [name] Date of Birth:  [birth year] Medical Record Number:  [id number]   Referring MD: Anthony Sar  Reason for Referral:  Papilloedema  HPI: 27 year young lady to notice headache behind her right eye at about 10 days ago. This lasted for about a week and was constant and moderate 7/10 in severity. She took ibuprofen which would releive headache for several hours. There was no accompanying nausea, vomiting and only mild light sensitivity. She did not notice any diminished vision or pain on eye movements. She saw optometrist  Dr. 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 Patient admits to have gained 40 pounds weight last 1 year. She has also been started on birth control pills 6 weeks ago. She denies any double vision, loss of vision, gait balance problems. She has no prior history of migraine headaches or significant neurological problems. 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 She had no tick bite or cat scratch. She is otherwise quite healthy. She is here 2 young children. She is currently a housewife. Chief does not take excess vitamins and started B. Vitamins just a week ago. She is a high school graduate and plans to go to college and study Update 06/30/2014 : She returns for follow-up after last visit 3 months ago. She reports some improvement in her headaches on Topamax but when he ran out she notices the headaches aren't worse. She is still taking only 25 mg at night and tolerating it well. She had lost about 14 pounds but unfortunately after the death of her grandmother whom she was quite attached to she has gained it all back. She reports a pressure-like sensation behind her eyes with intermittent sharp shooting pain going to the back. This seems to be more increased when she sneezing or coughing. She has discontinued birth control pills and has now been using copper IUD. She  underwent MRI scan of the brain and MRV on 03/30/14 which have both personally reviewed and are normal. Lab work on 03/09/14 shows normal ESR, ANA panel, anticardiolipin antibodies patient denies any blurred vision or loss of vision. She has not yet seen her optometrist for follow-up.she is again today not keen to do a diagnostic spinal tap to confirm diagnosis of pseudotumor cerebri Update 03/20/2016 :  Judy Taylor is seen today for follow-up after last visit more than a year and half ago. Patient states that she was doing better and Topamax was helping her eye pain and headaches but her psychiatrist stopped the Topamax 8 months ago after starting Effexor. The patient does not report significant worsening of a headache or vision difficulties. She was last seen by optometrist on 01/02/16 who noted that the optic disc swelling is about the same and not worse. I do not have those records to review today. Patient is still not keen to get a diagnostic spinal tap done to confirm the diagnosis of pseudotumor. She has also not lost significant weight but plans to do so soon. She has no new complaints. Update 04/27/2018 : she returns for follow-up after last visit 2 years ago.she states she will doing well while she was on Topamax but her psychiatrist discontinued it in  April this year. A couple of months later she started noticing intermittent sharp pain behind the right eye as well as migraine headaches. She denies any vision loss or difficulty reading. She  continues 6 monthly follow-ups with her ophthalmologist and at last visit she was found to have stable slight swelling of the right disc. The patient states she's been through a rough patch in the last year or so bouts of by depression. She is undergone several medication changes and is presently on Prozac which seems to be helping. She has gained a lot of weight but she wants to go on a diet and lose it back. She has no other complaints ROS:   14 system review of  systems is positive for  Headache, eye pain, frequent waking, depression and all other systems negative  PMH:      Past Medical History:  Diagnosis Date  . Depression   . Headache   . Hypertension     Social History:  Social History        Social History  . Marital status: Divorced    Spouse name: N/A  . Number of children: 2  . Years of education: 12th       Occupational History  . N/A        Social History Main Topics  . Smoking status: Current Every Day Smoker  . Smokeless tobacco: Never Used  . Alcohol use No  . Drug use: No  . Sexual activity: Yes       Other Topics Concern  . Not on file      Social History Narrative   Patient is married with 2 children.   Patient is right handed   Patient has hs education.   Patient drinks around 5 sodas daily.    Medications:         Current Outpatient Prescriptions on File Prior to Visit  Medication Sig Dispense Refill  . clonazePAM (KLONOPIN) 0.5 MG tablet Take 0.5 mg by mouth 2 (two) times daily.    . fluticasone (FLONASE) 50 MCG/ACT nasal spray Place 1 spray into both nostrils as needed.   0  . lamoTRIgine (LAMICTAL) 200 MG tablet Take 200 mg by mouth 2 (two) times daily.    Marland Kitchen LORazepam (ATIVAN) 1 MG tablet Take 1 mg by mouth as needed.    . lurasidone (LATUDA) 20 MG TABS tablet Take 20 mg by mouth.     No current facility-administered medications on file prior to visit.     Allergies:       Allergies  Allergen Reactions  . Bactrim [Sulfamethoxazole-Trimethoprim] Rash    Physical Exam General: mildly obese young lady, seated, in no evident distress Head: head normocephalic and atraumatic.     Neck: supple with no carotid or supraclavicular bruits Cardiovascular: regular rate and rhythm, no murmurs Musculoskeletal: no deformity Skin:  no rash/petichiae Vascular:  Normal pulses all extremities    Vitals:   03/20/16 1101  BP: 131/84  Pulse: 97    Neurologic  Exam Mental Status: Awake and fully alert. Oriented to place and time. Recent and remote memory intact. Attention span, concentration and fund of knowledge appropriate. Mood and affect appropriate.  Cranial Nerves: Fundoscopic exam reveals mild blurred disc margins in nasal inferior and superior quadrants with preserved sharp temporal margins in the right eye. Left eye could not be visualized. No peripapillary hemorrhages or exudates are noted. Venous pulsations are sluggish  .vision acuity is normal. Pupils equal, briskly reactive to light. Extraocular movements full without nystagmus. Visual fields full to confrontation. Hearing intact. Facial sensation intact. Face, tongue, palate moves normally and symmetrically.  Motor: Normal bulk and tone. Normal strength in all tested  extremity muscles. Sensory.: intact to touch and pinprick and vibratory.  Coordination: Rapid alternating movements normal in all extremities. Finger-to-nose and heel-to-shin performed accurately bilaterally. Gait and Station: Arises from chair without difficulty. Stance is normal. Gait demonstrates normal stride length and balance . Able to heel, toe and tandem walk without difficulty.  Reflexes: 1+ and symmetric. Toes downgoing.       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 Discussed importance of weight loss in this condition. The patient had good response to Topamax in the past and wants to go back on it. I recommend she start topamax 25 mg twice daily but I advised her to discuss this with her psychiatrist as apparently it had been stopped and then. I also encouraged her to continue regular follow-ups with the  ophthalmologist to have her visual acuity and blind spot testing.She will return for follow-up in 2 months with Meghan my nurse practitioner  or call earlier if necessary She understands that that if she has worsening headaches or blurred vision she will return for a spinal tap. Greater than 50% time during this 25 minute visit was spent on counseling and coordination of care about pseudotumor, headache and vision loss on answering questions             Antony Contras, MD  [facility] [address] [place], [place] [address]  Phone [phone] Fax [phone]. Note: This document was prepared with digital dictation and possible smart phrase technology. Any transcriptional errors that result from this process are unintentional.

## 2018-07-05 ENCOUNTER — Ambulatory Visit: Payer: Medicaid Other | Admitting: Neurology

## 2018-07-05 ENCOUNTER — Telehealth: Payer: Self-pay

## 2018-07-05 NOTE — Telephone Encounter (Signed)
Patient no show for appt today. 

## 2019-03-07 ENCOUNTER — Other Ambulatory Visit: Payer: Self-pay

## 2019-03-07 DIAGNOSIS — Z20822 Contact with and (suspected) exposure to covid-19: Secondary | ICD-10-CM

## 2019-03-10 LAB — NOVEL CORONAVIRUS, NAA: SARS-CoV-2, NAA: NOT DETECTED

## 2019-03-11 ENCOUNTER — Telehealth: Payer: Self-pay

## 2019-03-11 NOTE — Telephone Encounter (Signed)
Pt called to get COVID results, made her aware they are negative and set her up for MyChart

## 2019-06-17 ENCOUNTER — Other Ambulatory Visit: Payer: Self-pay

## 2019-06-17 DIAGNOSIS — Z20822 Contact with and (suspected) exposure to covid-19: Secondary | ICD-10-CM

## 2019-06-19 ENCOUNTER — Telehealth: Payer: Self-pay

## 2019-06-19 LAB — NOVEL CORONAVIRUS, NAA: SARS-CoV-2, NAA: NOT DETECTED

## 2019-06-19 NOTE — Telephone Encounter (Signed)
Patient called in requesting [place] lab results and MyChart set up assistance  -   DOB/Address verified - Negative results given, assisted with MyChart setup, no further questions,

## 2020-06-29 ENCOUNTER — Ambulatory Visit (HOSPITAL_COMMUNITY)
Admission: EM | Admit: 2020-06-29 | Discharge: 2020-06-29 | Disposition: A | Discharge status: Other Acute Inpatient Hospital | Payer: Medicaid Other

## 2020-06-29 ENCOUNTER — Other Ambulatory Visit: Payer: Self-pay

## 2020-06-29 NOTE — ED Triage Notes (Signed)
Patient states she is treated for depression and has had a breakdown yesterday and does not feel medications are working. Patient states she was diagnosis with Bipolar about three years ago. Patient states she needs a psychiatrist and medication adjusted.

## 2021-02-13 ENCOUNTER — Other Ambulatory Visit: Payer: Self-pay | Admitting: Orthopedic Surgery

## 2021-02-20 ENCOUNTER — Encounter: Payer: Self-pay | Admitting: *Deleted

## 2021-02-21 ENCOUNTER — Ambulatory Visit: Payer: Medicaid Other | Admitting: Neurology

## 2021-02-21 ENCOUNTER — Other Ambulatory Visit: Payer: Self-pay

## 2021-02-21 ENCOUNTER — Ambulatory Visit: Payer: Self-pay | Admitting: Neurology

## 2021-02-21 ENCOUNTER — Encounter: Payer: Self-pay | Admitting: Neurology

## 2021-02-21 NOTE — Progress Notes (Signed)
Error

## 2021-02-26 ENCOUNTER — Ambulatory Visit: Payer: Self-pay | Admitting: Neurology

## 2021-03-05 DIAGNOSIS — Z8616 Personal history of COVID-19: Secondary | ICD-10-CM

## 2021-03-05 HISTORY — DX: Personal history of COVID-19: Z86.16

## 2021-03-13 ENCOUNTER — Telehealth: Payer: Self-pay | Admitting: Neurology

## 2021-03-13 NOTE — Telephone Encounter (Signed)
Percell Belt, Dr. Dione Booze asked me to see this patient. She has seen Dr. Pearlean Brownie in the past but I am more than happy to see her. She came to the office and was not seen by Dr. Frances Furbish which caused a little bit of an issue, understandably for her. I will see her. Can we add her the end of the day sometime in August please, we need to make this up to patient. I spoke to her and smoothed things over. Maybe a 4 or 430 one day whatever appointment is fine, or if I have an open EMG, I just need extra time with her. thanks

## 2021-03-19 ENCOUNTER — Other Ambulatory Visit: Payer: Self-pay

## 2021-03-19 ENCOUNTER — Encounter (HOSPITAL_BASED_OUTPATIENT_CLINIC_OR_DEPARTMENT_OTHER): Payer: Self-pay | Admitting: Orthopedic Surgery

## 2021-03-26 ENCOUNTER — Encounter (HOSPITAL_BASED_OUTPATIENT_CLINIC_OR_DEPARTMENT_OTHER)
Admission: RE | Disposition: A | Discharge status: Home / Self Care | Payer: Self-pay | Source: Ambulatory Visit | Attending: Orthopedic Surgery

## 2021-03-26 ENCOUNTER — Other Ambulatory Visit: Payer: Self-pay

## 2021-03-26 ENCOUNTER — Ambulatory Visit (HOSPITAL_BASED_OUTPATIENT_CLINIC_OR_DEPARTMENT_OTHER): Payer: Medicaid Other | Admitting: Anesthesiology

## 2021-03-26 ENCOUNTER — Encounter (HOSPITAL_BASED_OUTPATIENT_CLINIC_OR_DEPARTMENT_OTHER): Payer: Self-pay | Admitting: Orthopedic Surgery

## 2021-03-26 ENCOUNTER — Ambulatory Visit (HOSPITAL_BASED_OUTPATIENT_CLINIC_OR_DEPARTMENT_OTHER)
Admission: RE | Admit: 2021-03-26 | Discharge: 2021-03-26 | Disposition: A | Discharge status: Home / Self Care | Payer: Medicaid Other | Source: Ambulatory Visit | Attending: Orthopedic Surgery | Admitting: Orthopedic Surgery

## 2021-03-26 DIAGNOSIS — Z91048 Other nonmedicinal substance allergy status: Secondary | ICD-10-CM | POA: Insufficient documentation

## 2021-03-26 DIAGNOSIS — Z882 Allergy status to sulfonamides status: Secondary | ICD-10-CM | POA: Diagnosis not present

## 2021-03-26 DIAGNOSIS — Z91040 Latex allergy status: Secondary | ICD-10-CM | POA: Insufficient documentation

## 2021-03-26 DIAGNOSIS — Z8616 Personal history of COVID-19: Secondary | ICD-10-CM | POA: Insufficient documentation

## 2021-03-26 DIAGNOSIS — F17210 Nicotine dependence, cigarettes, uncomplicated: Secondary | ICD-10-CM | POA: Insufficient documentation

## 2021-03-26 DIAGNOSIS — G5601 Carpal tunnel syndrome, right upper limb: Secondary | ICD-10-CM | POA: Insufficient documentation

## 2021-03-26 HISTORY — DX: Nausea with vomiting, unspecified: R11.2

## 2021-03-26 HISTORY — PX: CARPAL TUNNEL RELEASE: SHX101

## 2021-03-26 HISTORY — DX: Other specified postprocedural states: Z98.890

## 2021-03-26 HISTORY — DX: Unspecified asthma, uncomplicated: J45.909

## 2021-03-26 LAB — POCT PREGNANCY, URINE
Preg Test, Ur: NEGATIVE
Preg Test, Ur: NEGATIVE

## 2021-03-26 SURGERY — CARPAL TUNNEL RELEASE
Anesthesia: Monitor Anesthesia Care | Site: Wrist | Laterality: Right

## 2021-03-26 MED ORDER — ONDANSETRON HCL 4 MG/2ML IJ SOLN
INTRAMUSCULAR | Status: DC | PRN
Start: 2021-03-26 — End: 2021-03-26
  Administered 2021-03-26: 4 mg via INTRAVENOUS

## 2021-03-26 MED ORDER — MIDAZOLAM HCL 2 MG/2ML IJ SOLN
INTRAMUSCULAR | Status: AC
  Filled 2021-03-26: qty 2

## 2021-03-26 MED ORDER — TRAMADOL HCL 50 MG PO TABS: 50.0000 mg | ORAL_TABLET | Freq: Four times a day (QID) | ORAL | 0 refills | Status: DC | PRN

## 2021-03-26 MED ORDER — ONDANSETRON HCL 4 MG/2ML IJ SOLN
INTRAMUSCULAR | Status: AC
  Filled 2021-03-26: qty 2

## 2021-03-26 MED ORDER — PROMETHAZINE HCL 25 MG/ML IJ SOLN: 6.2500 mg | INTRAMUSCULAR | Status: DC | PRN

## 2021-03-26 MED ORDER — LACTATED RINGERS IV SOLN: INTRAVENOUS | Status: DC

## 2021-03-26 MED ORDER — AMISULPRIDE (ANTIEMETIC) 5 MG/2ML IV SOLN: 10.0000 mg | Freq: Once | INTRAVENOUS | Status: DC | PRN

## 2021-03-26 MED ORDER — CEFAZOLIN SODIUM-DEXTROSE 2-4 GM/100ML-% IV SOLN
INTRAVENOUS | Status: AC
  Filled 2021-03-26: qty 100

## 2021-03-26 MED ORDER — FENTANYL CITRATE (PF) 100 MCG/2ML IJ SOLN
INTRAMUSCULAR | Status: AC
  Filled 2021-03-26: qty 2

## 2021-03-26 MED ORDER — ACETAMINOPHEN 500 MG PO TABS
1000.0000 mg | ORAL_TABLET | Freq: Once | ORAL | Status: AC
  Administered 2021-03-26: 1000 mg via ORAL

## 2021-03-26 MED ORDER — 0.9 % SODIUM CHLORIDE (POUR BTL) OPTIME
TOPICAL | Status: DC | PRN
  Administered 2021-03-26: 50 mL

## 2021-03-26 MED ORDER — FENTANYL CITRATE (PF) 100 MCG/2ML IJ SOLN
INTRAMUSCULAR | Status: DC | PRN
  Administered 2021-03-26: 50 ug via INTRAVENOUS
  Administered 2021-03-26 (×2): 25 ug via INTRAVENOUS

## 2021-03-26 MED ORDER — SCOPOLAMINE 1 MG/3DAYS TD PT72
MEDICATED_PATCH | TRANSDERMAL | Status: AC
  Filled 2021-03-26: qty 1

## 2021-03-26 MED ORDER — FENTANYL CITRATE (PF) 100 MCG/2ML IJ SOLN: 25.0000 ug | INTRAMUSCULAR | Status: DC | PRN

## 2021-03-26 MED ORDER — CEFAZOLIN SODIUM-DEXTROSE 2-4 GM/100ML-% IV SOLN
2.0000 g | INTRAVENOUS | Status: AC
Start: 2021-03-26 — End: 2021-03-26
  Administered 2021-03-26: 2 g via INTRAVENOUS

## 2021-03-26 MED ORDER — ACETAMINOPHEN 500 MG PO TABS
ORAL_TABLET | ORAL | Status: AC
  Filled 2021-03-26: qty 2

## 2021-03-26 MED ORDER — PROPOFOL 10 MG/ML IV BOLUS
INTRAVENOUS | Status: DC | PRN
  Administered 2021-03-26: 20 mg via INTRAVENOUS

## 2021-03-26 MED ORDER — MIDAZOLAM HCL 5 MG/5ML IJ SOLN
INTRAMUSCULAR | Status: DC | PRN
  Administered 2021-03-26: 2 mg via INTRAVENOUS

## 2021-03-26 MED ORDER — BUPIVACAINE HCL (PF) 0.25 % IJ SOLN
INTRAMUSCULAR | Status: DC | PRN
  Administered 2021-03-26: 7 mL

## 2021-03-26 MED ORDER — PROPOFOL 500 MG/50ML IV EMUL
INTRAVENOUS | Status: DC | PRN
  Administered 2021-03-26: 75 ug/kg/min via INTRAVENOUS

## 2021-03-26 MED ORDER — LIDOCAINE HCL (PF) 0.5 % IJ SOLN
INTRAMUSCULAR | Status: DC | PRN
  Administered 2021-03-26: 25 mL via INTRAVENOUS

## 2021-03-26 SURGICAL SUPPLY — 32 items
APL PRP STRL LF DISP 70% ISPRP (MISCELLANEOUS) ×1
BLADE SURG 15 STRL LF DISP TIS (BLADE) ×1 IMPLANT
BLADE SURG 15 STRL SS (BLADE) ×2
BNDG CMPR 9X4 STRL LF SNTH (GAUZE/BANDAGES/DRESSINGS) ×1
BNDG COHESIVE 3X5 TAN STRL LF (GAUZE/BANDAGES/DRESSINGS) ×2 IMPLANT
BNDG ESMARK 4X9 LF (GAUZE/BANDAGES/DRESSINGS) ×2 IMPLANT
BNDG GAUZE ELAST 4 BULKY (GAUZE/BANDAGES/DRESSINGS) ×2 IMPLANT
CHLORAPREP W/TINT 26 (MISCELLANEOUS) ×2 IMPLANT
CORD BIPOLAR FORCEPS 12FT (ELECTRODE) ×2 IMPLANT
COVER BACK TABLE 60X90IN (DRAPES) ×2 IMPLANT
COVER MAYO STAND STRL (DRAPES) ×2 IMPLANT
CUFF TOURN SGL QUICK 18X4 (TOURNIQUET CUFF) ×2 IMPLANT
DRAPE EXTREMITY T 121X128X90 (DISPOSABLE) ×2 IMPLANT
DRAPE SURG 17X23 STRL (DRAPES) ×2 IMPLANT
DRSG PAD ABDOMINAL 8X10 ST (GAUZE/BANDAGES/DRESSINGS) ×2 IMPLANT
GAUZE SPONGE 4X4 12PLY STRL (GAUZE/BANDAGES/DRESSINGS) ×2 IMPLANT
GAUZE XEROFORM 1X8 LF (GAUZE/BANDAGES/DRESSINGS) ×2 IMPLANT
GLOVE SURG ORTHO LTX SZ8 (GLOVE) IMPLANT
GLOVE SURG UNDER POLY LF SZ8.5 (GLOVE) ×4 IMPLANT
GOWN STRL REUS W/ TWL LRG LVL3 (GOWN DISPOSABLE) IMPLANT
GOWN STRL REUS W/TWL LRG LVL3 (GOWN DISPOSABLE)
GOWN STRL REUS W/TWL XL LVL3 (GOWN DISPOSABLE) ×4 IMPLANT
NEEDLE PRECISIONGLIDE 27X1.5 (NEEDLE) IMPLANT
NS IRRIG 1000ML POUR BTL (IV SOLUTION) ×2 IMPLANT
PACK BASIN DAY SURGERY FS (CUSTOM PROCEDURE TRAY) ×2 IMPLANT
STOCKINETTE 4X48 STRL (DRAPES) ×2 IMPLANT
SUT ETHILON 4 0 PS 2 18 (SUTURE) ×2 IMPLANT
SUT VICRYL 4-0 PS2 18IN ABS (SUTURE) IMPLANT
SYR BULB EAR ULCER 3OZ GRN STR (SYRINGE) ×2 IMPLANT
SYR CONTROL 10ML LL (SYRINGE) IMPLANT
TOWEL GREEN STERILE FF (TOWEL DISPOSABLE) ×2 IMPLANT
UNDERPAD 30X36 HEAVY ABSORB (UNDERPADS AND DIAPERS) ×2 IMPLANT

## 2021-03-26 NOTE — Transfer of Care (Signed)
Immediate Anesthesia Transfer of Care Note  Patient: Judy Taylor  Procedure(s) Performed: RIGHT CARPAL TUNNEL RELEASE (Right: Wrist)  Patient Location: PACU  Anesthesia Type:MAC and Bier block  Level of Consciousness: drowsy, patient cooperative and responds to stimulation  Airway & Oxygen Therapy: Patient Spontanous Breathing and Patient connected to face mask oxygen  Post-op Assessment: Report given to RN and Post -op Vital signs reviewed and stable  Post vital signs: Reviewed and stable  Last Vitals:  Vitals Value Taken Time  BP    Temp    Pulse    Resp    SpO2      Last Pain:  Vitals:   03/26/21 0848  TempSrc: Oral  PainSc: 0-No pain      Patients Stated Pain Goal: 5 (12/92/90 9030)  Complications: No notable events documented.

## 2021-03-26 NOTE — Brief Op Note (Signed)
03/26/2021  10:19 AM  PATIENT:  [name]  35 y.o. female  PRE-OPERATIVE DIAGNOSIS:  RIGHT CARPAL TUNNEL SYNDROME  POST-OPERATIVE DIAGNOSIS:  RIGHT CARPAL TUNNEL SYNDROME  PROCEDURE:  Procedure(s): RIGHT CARPAL TUNNEL RELEASE (Right)  SURGEON:  Surgeon(s) and Role:    * Cindee Salt, MD - Primary  PHYSICIAN ASSISTANT:   ASSISTANTS: none   ANESTHESIA:   local, regional, and IV sedation  EBL:  0 mL   BLOOD ADMINISTERED:none  DRAINS: none   LOCAL MEDICATIONS USED:  BUPIVICAINE   SPECIMEN:  No Specimen  DISPOSITION OF SPECIMEN:  N/A  COUNTS:  YES  TOURNIQUET:   Total Tourniquet Time Documented: Forearm (Right) - 19 minutes Total: Forearm (Right) - 19 minutes   DICTATION: .Reubin Milan Dictation  PLAN OF CARE: Discharge to home after PACU  PATIENT DISPOSITION:  PACU - hemodynamically stable.

## 2021-03-26 NOTE — Discharge Instructions (Addendum)
[facility] Center Instructions Hand Surgery  Wound Care: Keep your hand elevated above the level of your heart.  Do not allow it to dangle by your side.  Keep the dressing dry and do not remove it unless your doctor advises you to do so.  He will usually change it at the time of your post-op visit.  Moving your fingers is advised to stimulate circulation but will depend on the site of your surgery.  If you have a splint applied, your doctor will advise you regarding movement.  Activity: Do not drive or operate machinery today.  Rest today and then you may return to your normal activity and work as indicated by your physician.  Diet:  Drink liquids today or eat a light diet.  You may resume a regular diet tomorrow.    General expectations: Pain for two to three days. Fingers may become slightly swollen.  Call your doctor if any of the following occur: Severe pain not relieved by pain medication. Elevated temperature. Dressing soaked with blood.   Post Anesthesia Home Care Instructions  Activity: Get plenty of rest for the remainder of the day. A responsible individual must stay with you for 24 hours following the procedure.  For the next 24 hours, DO NOT: -Drive a car -Advertising copywriter -Drink alcoholic beverages -Take any medication unless instructed by your physician -Make any legal decisions or sign important papers.  Meals: Start with liquid foods such as gelatin or soup. Progress to regular foods as tolerated. Avoid greasy, spicy, heavy foods. If nausea and/or vomiting occur, drink only clear liquids until the nausea and/or vomiting subsides. Call your physician if vomiting continues.  Special Instructions/Symptoms: Your throat may feel dry or sore from the anesthesia or the breathing tube placed in your throat during surgery. If this causes discomfort, gargle with warm salt water. The discomfort should disappear within 24 hours.  If you had a scopolamine patch placed behind your  ear for the management of post- operative nausea and/or vomiting:  1. The medication in the patch is effective for 72 hours, after which it should be removed.  Wrap patch in a tissue and discard in the trash. Wash hands thoroughly with soap and water. 2. You may remove the patch earlier than 72 hours if you experience unpleasant side effects which may include dry mouth, dizziness or visual disturbances. 3. Avoid touching the patch. Wash your hands with soap and water after contact with the patch.     Inability to move fingers. White or bluish color to fingers.

## 2021-03-26 NOTE — Op Note (Signed)
NAME: Judy Taylor MEDICAL RECORD NO: [id number] DATE OF BIRTH: 31-Mar-1986 FACILITY: [facility] LOCATION: [facility] PHYSICIAN: Nicki Reaper, MD   OPERATIVE REPORT   DATE OF PROCEDURE: 03/26/21    PREOPERATIVE DIAGNOSIS: Carpal tunnel syndrome right hand   POSTOPERATIVE DIAGNOSIS: Same   PROCEDURE: Decompression median nerve right hand   SURGEON: Cindee Salt, M.D.   ASSISTANT: none   ANESTHESIA:  Bier block with sedation and Local   INTRAVENOUS FLUIDS:  Per anesthesia flow sheet.   ESTIMATED BLOOD LOSS:  Minimal.   COMPLICATIONS:  None.   SPECIMENS:  none   TOURNIQUET TIME:    Total Tourniquet Time Documented: Forearm (Right) - 19 minutes Total: Forearm (Right) - 19 minutes    DISPOSITION:  Stable to PACU.   INDICATIONS: Patient is a 35 year old female with a history of numbness and tingling right hand.  Nerve conductions are positive.  This does not respond to conservative treatment she is elected undergo surgical release of the median nerve right side.  Pre-.  Postoperative course been discussed along with risk and complications.  She is aware that there is no guarantee to the surgery the possibility of infection recurrence injury to arteries nerves tendons incomplete relief symptoms dystrophy.  Preoperative area the patient is seen the extremity marked by both patient and surgeon antibiotic given  OPERATIVE COURSE: Patient is brought to the operating room placed in the supine position with the right arm free.  A forearm IV regional anesthetic was carried out without difficulty under the direction the anesthesia department.  She was prepped with ChloraPrep.  A 3-minute dry time was allowed and a timeout taken confirming patient procedure.  A longitudinal incision was made in the right palm carried down through subcutaneous tissue.  Bleeders were electrocauterized with bipolar.  Palmar fascia was split.  Superficial palmar arch was identified along with the  flexor tendon to the ring little finger.  The palmar fascia was split.  The superficial palmar arch was identified.  Retractors were placed retracting the median nerve and flexor tendons radially after identifying the flexor tendon to the ring finger.  The flexor retinaculum was then released on its ulnar border.  A right angle and stool retractor were placed between skin and forearm fascia.  The fascia was released for approximately 2 cm proximal to the wrist crease under direct vision using blunt scissors.  The canal was explored.  Area compression of the nerve was apparent.  Motor branch entered in the muscle distally.  The wound was copiously irrigated with saline.  The skin was closed interrupted 4-0 nylon sutures.  Local infiltration quarter percent bupivacaine without epinephrine was given 6 cc to 7 cc was used.  A sterile compressive dressing with fingers 3 was applied.  On deflation of the tourniquet all fingers immediately pink.  She was taken to the recovery room for observation in satisfactory condition.  She will be discharged home to return to the hand center [facility] in 1 week and Tylenol ibuprofen for pain with Ultram for breakthrough.   Cindee Salt, MD Electronically signed, 03/26/21

## 2021-03-26 NOTE — H&P (Signed)
Judy Taylor is an 35 y.o. female.   Chief Complaint: numbness right hand HPI: Judy Taylor is a 35 year old right-hand-dominant female referred by Dr. Garner Nash for consultation regarding numbness tingling and a feeling of swelling in her right hand this is on the hyperthenar area and thenar eminence of her right hand with the tingling of all of her fingers is been going approximately 5 months she has no history of injury to the hand or to the neck she has not awakened at night. She has been taking ibuprofen which has given her some relief that is primarily for her optic nerve swelling. She has occasional symptoms on her left side. She She had an injection. she does have positive nerve conductions done by Dr. Neldon Newport. She states that the injection gave her excellent relief of symptoms for a week and then it gradually began coming back. She is being awakened occasionally at night. Her numbness and tingling is not constant is in the median nerve distribution. She has no symptoms on her left side. She has taken nonsteroidals in the past. She has no history of diabetes thyroid problems arthritis or gout. Family history is positive arthritis Past Medical History:  Diagnosis Date   Anxiety    Depression    GERD (gastroesophageal reflux disease)    Headache    History of COVID-19 03/05/2021   Hypertension    Panic disorder    Papilledema    PONV (postoperative nausea and vomiting)     Past Surgical History:  Procedure Laterality Date   CESAREAN SECTION     CESAREAN SECTION     x 3   TUBAL LIGATION      Family History  Problem Relation Age of Onset   High blood pressure Mother    Heart attack Maternal Grandmother    Dementia Paternal Grandmother    Social History:  reports that she has been smoking cigarettes. She has never used smokeless tobacco. She reports previous alcohol use. She reports that she does not use drugs.  Allergies:  Allergies  Allergen Reactions   Bactrim  [Sulfamethoxazole-Trimethoprim] Rash   Bactrim [Sulfamethoxazole-Trimethoprim] Rash   Latex Rash   Wound Dressing Adhesive Rash    No medications prior to admission.    No results found for this or any previous visit (from the past 48 hour(s)).  No results found.   Pertinent items are noted in HPI.  Height 4\' 11"  (1.499 m), weight 80.3 kg, last menstrual period 03/14/2021.  General appearance: alert, cooperative, and appears stated age Head: Normocephalic, without obvious abnormality Neck: no JVD Resp: clear to auscultation bilaterally Cardio: regular rate and rhythm, S1, S2 normal, no murmur, click, rub or gallop GI: soft, non-tender; bowel sounds normal; no masses,  no organomegaly Extremities:  numbness right hand Pulses: 2+ and symmetric Skin: Skin color, texture, turgor normal. No rashes or lesions Neurologic: Grossly normal Incision/Wound: na  Assessment/Plan Assessment:  1. Carpal tunnel syndrome of right wrist    Plan: She would like to proceed to have this surgically released. Pre-. Postoperative course been discussed along with risk and complications. She is aware there is no guarantee to the surgery the possibility of infection recurrence injury to arteries nerves tendons complete relief symptoms dystrophy. She is scheduled for a carpal tunnel release in outpatient under regional anesthesia.  03/16/2021 03/26/2021, 5:10 AM

## 2021-03-26 NOTE — Anesthesia Postprocedure Evaluation (Signed)
Anesthesia Post Note  Patient: Judy Taylor  Procedure(s) Performed: RIGHT CARPAL TUNNEL RELEASE (Right: Wrist)     Patient location during evaluation: PACU Anesthesia Type: MAC Level of consciousness: awake and alert Pain management: pain level controlled Vital Signs Assessment: post-procedure vital signs reviewed and stable Respiratory status: spontaneous breathing and respiratory function stable Cardiovascular status: stable Postop Assessment: no apparent nausea or vomiting Anesthetic complications: no   No notable events documented.  Last Vitals:  Vitals:   03/26/21 1030 03/26/21 1036  BP: 120/75 116/73  Pulse: 67 70  Resp: (!) 22 18  Temp:  (!) 36.3 C  SpO2: 99% 98%    Last Pain:  Vitals:   03/26/21 1036  TempSrc:   PainSc: 0-No pain                 Malanie Koloski DANIEL

## 2021-03-26 NOTE — Anesthesia Procedure Notes (Signed)
Anesthesia Regional Block: Bier block (IV Regional)   Pre-Anesthetic Checklist: , timeout performed,  Correct Patient, Correct Site, Correct Laterality,,  Laterality: Right  Prep: alcohol swabs        Procedures:,,,,, intact distal pulses, Esmarch exsanguination    Narrative:  Start time: 03/26/2021 9:48 AM End time: 03/26/2021 9:49 AM Injection made incrementally with aspirations every 25 mL. CRNA: Thornell Mule, CRNA

## 2021-03-26 NOTE — Anesthesia Preprocedure Evaluation (Addendum)
Anesthesia Evaluation  Patient identified by MRN, date of birth, ID band Patient awake    Reviewed: Allergy & Precautions, NPO status , Patient's Chart, lab work & pertinent test results  History of Anesthesia Complications (+) PONV and history of anesthetic complications  Airway Mallampati: II  TM Distance: >3 FB Neck ROM: Full    Dental no notable dental hx. (+) Dental Advisory Given   Pulmonary neg pulmonary ROS, Current Smoker,    Pulmonary exam normal        Cardiovascular hypertension, Normal cardiovascular exam     Neuro/Psych Anxiety Depression negative neurological ROS     GI/Hepatic Neg liver ROS, GERD  ,  Endo/Other  negative endocrine ROS  Renal/GU negative Renal ROS     Musculoskeletal negative musculoskeletal ROS (+)   Abdominal   Peds  Hematology negative hematology ROS (+)   Anesthesia Other Findings   Reproductive/Obstetrics                           Anesthesia Physical Anesthesia Plan  ASA: 2  Anesthesia Plan: MAC and Bier Block and Bier Block-LIDOCAINE ONLY   Post-op Pain Management:    Induction:   PONV Risk Score and Plan: 2 and Ondansetron and Propofol infusion  Airway Management Planned: Natural Airway  Additional Equipment:   Intra-op Plan:   Post-operative Plan:   Informed Consent: I have reviewed the patients History and Physical, chart, labs and discussed the procedure including the risks, benefits and alternatives for the proposed anesthesia with the patient or authorized representative who has indicated his/her understanding and acceptance.     Dental advisory given  Plan Discussed with: Anesthesiologist and CRNA  Anesthesia Plan Comments:        Anesthesia Quick Evaluation

## 2021-03-26 NOTE — Anesthesia Procedure Notes (Signed)
Procedure Name: MAC Date/Time: 03/26/2021 9:43 AM Performed by: Glory Buff, CRNA Oxygen Delivery Method: Simple face mask

## 2021-03-27 ENCOUNTER — Encounter (HOSPITAL_BASED_OUTPATIENT_CLINIC_OR_DEPARTMENT_OTHER): Payer: Self-pay | Admitting: Orthopedic Surgery

## 2021-04-03 ENCOUNTER — Ambulatory Visit: Payer: Medicaid Other | Admitting: Neurology

## 2021-04-03 ENCOUNTER — Other Ambulatory Visit: Payer: Self-pay

## 2021-04-03 ENCOUNTER — Encounter: Payer: Self-pay | Admitting: Neurology

## 2021-04-03 VITALS — BP 119/83 | HR 93 | Ht 58.5 in | Wt 179.6 lb

## 2021-04-03 DIAGNOSIS — R519 Headache, unspecified: Secondary | ICD-10-CM | POA: Diagnosis not present

## 2021-04-03 DIAGNOSIS — G932 Benign intracranial hypertension: Secondary | ICD-10-CM

## 2021-04-03 DIAGNOSIS — G08 Intracranial and intraspinal phlebitis and thrombophlebitis: Secondary | ICD-10-CM

## 2021-04-03 DIAGNOSIS — H538 Other visual disturbances: Secondary | ICD-10-CM

## 2021-04-03 DIAGNOSIS — E669 Obesity, unspecified: Secondary | ICD-10-CM

## 2021-04-03 DIAGNOSIS — H4711 Papilledema associated with increased intracranial pressure: Secondary | ICD-10-CM

## 2021-04-03 DIAGNOSIS — H539 Unspecified visual disturbance: Secondary | ICD-10-CM

## 2021-04-03 DIAGNOSIS — G8929 Other chronic pain: Secondary | ICD-10-CM

## 2021-04-03 DIAGNOSIS — R400 Somnolence: Secondary | ICD-10-CM

## 2021-04-03 DIAGNOSIS — H9190 Unspecified hearing loss, unspecified ear: Secondary | ICD-10-CM

## 2021-04-03 NOTE — Progress Notes (Signed)
[facility]    Provider:  Dr Lucia Gaskins Requesting Provider: Fabian Sharp MD Primary Care Provider:  [facility]  CC:  papilledema  HPI:  Judy Taylor is a 35 y.o. female here as requested by [facility]* for papilledema. Reviewed Dr. Laruth Bouchard notes: nerve photos show 270 degree of margin blurring, no vessel obscuration, OCT show symmetric edema OU stable from 3 months ago.  She was told she had swelling behind her eyes. She actually saw Dr. Pearlean Brownie in the past, papilledema was mentioned back then, MRI brain and MRV were normal, she did not have the lumbar puncture completed then her topiramate was started by psychiatry and she got better. She has tried to lose weight. Feels like a tiny person behind her eye, going on for years, sometimes in the back of her head, sometimes pointed towards the right side, 2-3x a week, lasts "a while" but can be dull all day long, can be severeshe has hearing changes. She is supposed to wear glasses, her vision is a little blurry. She snores, she is very tired, she falls asleep during the day. No other focal neurologic deficits, associated symptoms, inciting events or modifiable factors.  Reviewed notes, labs and imaging from outside physicians, which showed:  Reviewed Dr. Laruth Bouchard notes: nerve photos show 270 degree of margin blurring, no vessel obscuration, OCT show symmetric edema OU stable from 3 months ago.  MRI/MRV of the brain in 2015 were normal, rviewed imaging and agree  Review of Systems: Patient complains of symptoms per HPI as well as the following symptoms sleepiness. Pertinent negatives and positives per HPI. All others negative.   Social History   Socioeconomic History   Marital status: Married    Spouse name: Not on file   Number of children: 2   Years of education: 12th   Highest education level: Not on file  Occupational History   Occupation: N/A  Tobacco Use   Smoking status: Some  Days    Types: Cigarettes   Smokeless tobacco: Never  Vaping Use   Vaping Use: Never used  Substance and Sexual Activity   Alcohol use: Not Currently   Drug use: No   Sexual activity: Yes    Birth control/protection: Surgical  Other Topics Concern   Not on file  Social History Narrative    Patient is married with 2 children.   Patient is right handed   Patient has hs education.   Patient drinks around 5 sodas daily.   Social Determinants of Health   Financial Resource Strain: Not on file  Food Insecurity: Not on file  Transportation Needs: Not on file  Physical Activity: Not on file  Stress: Not on file  Social Connections: Not on file  Intimate Partner Violence: Not on file    Family History  Problem Relation Age of Onset   High blood pressure Mother    Heart attack Maternal Grandmother    Dementia Paternal Grandmother     Past Medical History:  Diagnosis Date   Anxiety    Asthma    Depression    GERD (gastroesophageal reflux disease)    Headache    History of COVID-19 03/05/2021   Hypertension    Panic disorder    Papilledema    PONV (postoperative nausea and vomiting)     Patient Active Problem List   Diagnosis Date Noted   IIH (idiopathic intracranial hypertension) 04/07/2021   Papilloedema, 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/09/2014   Pseudotumor cerebri 03/09/2014  Obesity, 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/09/2014   Headache(784.0) 03/09/2014    Past Surgical History:  Procedure Laterality Date   CARPAL TUNNEL RELEASE Right 03/26/2021   Procedure: RIGHT CARPAL TUNNEL RELEASE;  Surgeon: Cindee Salt, MD;  Location:  SUR[facility]ERY [facility];  Service: Orthopedics;  Laterality: Right;   CESAREAN SECTION     CESAREAN SECTION     x 3   TUBAL LIGATION      Current Outpatient Medications  Medication Sig Dispense Refill   albuterol (VENTOLIN HFA) 108 (90 Base) MCG/ACT inhaler Inhale 2 puffs into the lungs every 6 (six) hours as needed for wheezing or shortness of breath.     ALPRAZolam  (XANAX) 0.25 MG tablet Take 1-2 tabs (0.25mg -0.50mg ) 30-60 minutes before procedure. May repeat if needed.Do not drive. 4 tablet 0   Cetirizine HCl (ZYRTEC PO) Take 10 mg by mouth.     clonazePAM (KLONOPIN) 0.5 MG tablet Take 0.5 mg by mouth daily as needed for anxiety.     fluticasone (FLONASE) 50 MCG/ACT nasal spray Place 1 spray into both nostrils as needed.   0   omeprazole (PRILOSEC) 40 MG capsule Take 40 mg by mouth daily.     traMADol (ULTRAM) 50 MG tablet Take 1 tablet (50 mg total) by mouth every 6 (six) hours as needed. (Patient not taking: Reported on 04/03/2021) 20 tablet 0   No current facility-administered medications for this visit.    Allergies as of 04/03/2021 - Review Complete 04/03/2021  Allergen Reaction Noted   Bactrim [sulfamethoxazole-trimethoprim] Rash 03/09/2014   Latex Rash 03/19/2021   Wound dressing adhesive Rash 03/19/2021    Vitals: BP 119/83   Pulse 93   Ht 4' 10.5" (1.486 m)   Wt 179 lb 9.6 oz (81.5 kg)   LMP 03/14/2021 (Exact Date)   BMI 36.90 kg/m  Last Weight:  Wt Readings from Last 1 Encounters:  04/03/21 179 lb 9.6 oz (81.5 kg)   Last Height:   Ht Readings from Last 1 Encounters:  04/03/21 4' 10.5" (1.486 m)     Physical exam: Exam: Gen: NAD, conversant, well nourised, obese, well groomed                     CV: RRR, no MRG. No Carotid Bruits. No peripheral edema, warm, nontender Eyes: Conjunctivae clear without exudates or hemorrhage  Neuro: Detailed Neurologic Exam  Speech:    Speech is normal; fluent and spontaneous with normal comprehension.  Cognition:    The patient is oriented to person, place, and time;     recent and remote memory intact;     language fluent;     normal attention, concentration,     fund of knowledge Cranial Nerves:    The pupils are equal, round, and reactive to light. Pappilledema OU. Visual fields are full to finger confrontation. Extraocular movements are intact. Trigeminal sensation is intact and  the muscles of mastication are normal. The face is symmetric. The palate elevates in the midline. Hearing intact. Voice is normal. Shoulder shrug is normal. The tongue has normal motion without fasciculations.   Coordination:    Normal    Gait:    normal.   Motor Observation:    No asymmetry, no atrophy, and no involuntary movements noted. Tone:    Normal muscle tone.    Posture:    Posture is normal. normal erect    Strength:    Strength is V/V in the upper and lower limbs.      Sensation:  intact to LT     Reflex Exam:  DTR's:    Deep tendon reflexes in the upper and lower extremities are normal bilaterally.   Toes:    The toes are downgoing bilaterally.   Clonus:    Clonus is absent.    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 Reviewed Dr. Laruth Bouchard notes: nerve photos show 270 degree of margin blurring, no vessel obscuration, OCT shows symmetric edema OU stable from 3 months ago. She also presents with headaches, Topiramate in the past helped, hearing changes, blurry vision and vision changes. Also need to evaluate for sleep apnea, she is very tired during the day, morning headaches. 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   MRI brain MRV head Lumbar puncture(spinal tap) Start diamox(acetazolamide) or Topiramate if positive  Dr. Vickey Huger for sleep evaluation [facility] Discussed risk of permanent vision loss, go to ED for any vision worsening Discussed weight loss. She does not have an IUD or hx of medication associated with IDIOPATHIC INTRACRANIAL HYPERTENSION   Orders Placed This Encounter  Procedures   MR BRAIN W WO CONTRAST   MR MRV HEAD WO CM   DG FLUORO GUIDED LOC OF NEEDLE/CATH TIP FOR SPINAL INJECT LT   CBC with Differential/Platelets   Comprehensive metabolic panel   TSH   Ambulatory referral to Sleep Studies   Ambulatory referral to Family Practice   Meds ordered  this encounter  Medications   ALPRAZolam (XANAX) 0.25 MG tablet    Sig: Take 1-2 tabs (0.25mg -0.50mg ) 30-60 minutes before procedure. May repeat if needed.Do not drive.    Dispense:  4 tablet    Refill:  0    Cc: Glen [place], [facility]*,  Groat,Scott MD  Naomie Dean, MD  [facility] [address] [place], [place] [address]  Phone [phone] Fax [phone]

## 2021-04-03 NOTE — Patient Instructions (Addendum)
MRI brain Lumbar puncture(spinal tap) Start diamox(acetazolamide) or Topiramate if positive  Dr. Vickey Huger for sleep evaluation [facility]   Ferri's Clinical Advisor 2018, 1st Edition (1st ed., pp. [id number].[phone].[id number]). [place], [place]: Elsevier."> Rosalie Gums and [facility] (7th ed., pp. [id number].e5). [place], [place]: Elsevier[employer] Inc."> https://www.ncbi.nlm.nih.gov/books/NBK507811/">  Idiopathic Intracranial Hypertension  Idiopathic intracranial hypertension (IIH) is a condition that increases pressure around the brain. The fluid that surrounds the brain and spinal cord (cerebrospinal fluid, or CSF) increases and causes the pressure. Idiopathic means that the cause ofthis condition is not known. IIH affects the brain and spinal cord (neurological disorder). If this condition is not treated, it can cause vision loss or blindness. What are the causes? The cause of this condition is not known. What increases the risk? The following factors may make you more likely to develop this condition: Being very overweight (obese). Being a female between the ages of 61 and 50 years old, who has not gone through menopause. Taking certain medicines, such as birth control or steroids. What are the signs or symptoms? Symptoms of this condition include: Headaches. This is the most common symptom. Brief episodes of total blindness. Double vision, blurred vision, or poor side (peripheral) vision. Pain in the shoulders or neck. Nausea and vomiting. A sound like rushing water or a pulsing sound within the ears (pulsatile tinnitus), or ringing in the ears. How is this diagnosed? This condition may be diagnosed based on: Your symptoms and medical history. Imaging tests of the brain, such as: CT scan. MRI. Magnetic resonance venogram (MRV) to check the veins. Diagnostic lumbar puncture. This is a procedure to remove and examine a sample of cerebrospinal fluid. This procedure can  determine whether too much fluid may be causing IIH. A thorough eye exam to check for swelling or nerve damage in the eyes. How is this treated? Treatment for this condition depends on the symptoms. The goal of treatment is to decrease the pressure around your brain. Common treatments include: Weight loss through healthy eating, salt restriction, and exercise, if you are overweight. Medicines to decrease the production of spinal fluid and lower the pressure within your skull. Medicines to prevent or treat headaches. Other treatments may include: Surgery to place drains (shunts) in your brain for removing excess fluid. Lumbar puncture to remove excess cerebrospinal fluid. Follow these instructions at home: If you are overweight or obese, work with your health care provider to lose weight. Take over-the-counter and prescription medicines only as told by your health care provider. Ask your health care provider if the medicine prescribed to you requires you to avoid driving or using machinery. Do not use any products that contain nicotine or tobacco, such as cigarettes, e-cigarettes, and chewing tobacco. If you need help quitting, ask your health care provider. Keep all follow-up visits as told by your health care provider. This is important. Contact a health care provider if: You have changes in your vision, such as: Double vision. Blurred vision. Poor peripheral vision. Get help right away if: You have any of the following symptoms and they get worse or do not get better: Headaches. Nausea. Vomiting. Sudden trouble seeing. Summary Idiopathic intracranial hypertension (IIH) is a condition that increases pressure around the brain. The cause is not known (is idiopathic). The most common symptom of IIH is headaches. Vision changes, pain in the shoulders or neck, nausea, and vomiting may also occur. Treatment for this condition depends on your symptoms. The goal of treatment is to decrease the  pressure around your brain. If you are overweight or obese, work with your health care provider to lose weight. Take over-the-counter and prescription medicines only as told by your health care provider. This information is not intended to replace advice given to you by your health care provider. Make sure you discuss any questions you have with your healthcare provider. Document Revised: 07/16/2019 Document Reviewed: 07/16/2019 Elsevier Patient Education  2022 Elsevier Inc.  Lumbar Puncture A lumbar puncture, also called a spinal tap, is a procedure that is done to remove a small amount of the fluid that surrounds the brain and spinal cord (cerebrospinal fluid, CSF). The fluid is then examined in the lab. This procedure may be done to: Help diagnose various problems, such as meningitis, encephalitis, multiple sclerosis, and other infections. Remove fluid and relieve pressure that occurs with certain types of headaches. Look for bleeding within the brain and spinal cord areas (central nervous system). Place medicine into the spinal fluid. Tell a health care provider about: Any allergies you have. All medicines you are taking, including vitamins, herbs, eye drops, creams, and over-the-counter medicines like aspirin or NSAIDs. Any problems you or family members have had with anesthetic medicines. Any blood disorders you have. Any surgeries you have had. Any medical conditions you have. Whether you are pregnant or may be pregnant. What are the risks? Generally, this is a safe procedure. However, problems may occur, including: Infection at the insertion site that can spread to the bone or spinal fluid. Bleeding. Leakage of CSF. Spinal headache. This is a severe headache that occurs when there is a leak of spinal fluid. Allergic reactions to medicines or dyes. Damage to other structures or organs. What happens before the procedure? Staying hydrated Follow instructions from your health care  provider about hydration, which may include: Up to 2 hours before the procedure - you may continue to drink clear liquids, such as water, clear fruit juice, black coffee, and plain tea. Eating and drinking restrictions Follow instructions from your health care provider about eating and drinking. If you will be given a medicine to help you relax (sedative), these instructions may include: 8 hours before the procedure - stop eating heavy meals or foods such as meat, fried foods, or fatty foods. 6 hours before the procedure - stop eating light meals or foods, such as toast or cereal. 6 hours before the procedure - stop drinking milk or drinks that contain milk. 2 hours before the procedure - stop drinking clear liquids. Medicines Ask your health care provider about: Changing or stopping your regular medicines. This is especially important if you are taking diabetes medicines or blood thinners. Taking medicines such as aspirin and ibuprofen. These medicines can thin your blood. Do not take these medicines before your procedure if your health care provider instructs you not to. You may be given antibiotic medicine to help prevent infection. If so, take the antibiotic as told by your health care provider. General instructions You may have a blood sample taken. You may be asked to shower with a germ-killing soap. Ask your health care provider how your surgical site will be marked or identified. Plan to have someone take you home from the hospital or clinic. This is especially important if you will be given a sedative. If you will be going home right after the procedure, plan to have someone with you for 24 hours. What happens during the procedure?  You may lie down on your side with your knees bent, or you  may sit with your head resting on a pillow on your lap. How you are positioned depends on your age and size. You will be positioned so that the spaces between the bones of the spine (vertebrae) are  as wide as possible. This will make it easier to pass the needle into the spinal canal. The skin on your lower back (lumbar region) will be cleaned. You will be given an injection of medicine to numb your lower back area (local anesthetic). You may be given pain medicine or a sedative. A small needle will be inserted into your lower back until it enters the space that contains the cerebrospinal fluid. The needle will not enter the spinal cord. Fluid will be collected into tubes. It will be sent to a lab for examination. The needle will be withdrawn, and a bandage (dressing) will be placed over the area. The procedure may vary among health care providers and hospitals. What happens after the procedure? Your blood pressure, heart rate, breathing rate, and blood oxygen level will be monitored until any medicines you were given have worn off. You may need to stay lying down for a while. You will need to drink plenty of fluids and caffeine to help prevent a headache. Do not drive for 24 hours if you received a sedative. Summary A lumbar puncture, also called a spinal tap, is a procedure that is done to remove a small amount of the cerebrospinal fluid, CSF. This may be done to help diagnose a wide variety of conditions. Before the procedure, tell your health care provider about all medicines you are taking, including vitamins, herbs, eye drops, creams, and over-the-counter medicines. Before the procedure, ask your health care provider about changing or stopping your regular medicines. This is especially important if you are taking blood thinners. Your lower back will be numbed with an injection before the needle is placed into your spinal canal. After the procedure, you will lie down for a while and you will drink plenty of fluids. This information is not intended to replace advice given to you by your health care provider. Make sure you discuss any questions you have with your healthcare  provider. Document Revised: 06/14/2020 Document Reviewed: 06/14/2020 Elsevier Patient Education  2022 [employer].

## 2021-04-04 LAB — COMPREHENSIVE METABOLIC PANEL
ALT: 15 IU/L (ref 0–32)
AST: 13 IU/L (ref 0–40)
Albumin/Globulin Ratio: 2.1 (ref 1.2–2.2)
Albumin: 4.9 g/dL — ABNORMAL HIGH (ref 3.8–4.8)
Alkaline Phosphatase: 76 IU/L (ref 44–121)
BUN/Creatinine Ratio: 16 (ref 9–23)
BUN: 11 mg/dL (ref 6–20)
Bilirubin Total: <0.2 mg/dL (ref 0.0–1.2)
CO2: 20 mmol/L (ref 20–29)
Calcium: 9.7 mg/dL (ref 8.7–10.2)
Chloride: 100 mmol/L (ref 96–106)
Creatinine, Ser: 0.69 mg/dL (ref 0.57–1.00)
Globulin, Total: 2.3 g/dL (ref 1.5–4.5)
Glucose: 74 mg/dL (ref 65–99)
Potassium: 4.4 mmol/L (ref 3.5–5.2)
Sodium: 135 mmol/L (ref 134–144)
Total Protein: 7.2 g/dL (ref 6.0–8.5)
eGFR: 117 mL/min/{1.73_m2} (ref >59)

## 2021-04-04 LAB — CBC WITH DIFFERENTIAL/PLATELET
Basophils Absolute: 0 10*3/uL (ref 0.0–0.2)
Basos: 0 %
EOS (ABSOLUTE): 0.1 10*3/uL (ref 0.0–0.4)
Eos: 1 %
Hematocrit: 35.2 % (ref 34.0–46.6)
Hemoglobin: 10.9 g/dL — ABNORMAL LOW (ref 11.1–15.9)
Immature Grans (Abs): 0 10*3/uL (ref 0.0–0.1)
Immature Granulocytes: 0 %
Lymphocytes Absolute: 3.1 10*3/uL (ref 0.7–3.1)
Lymphs: 25 %
MCH: 24.7 pg — ABNORMAL LOW (ref 26.6–33.0)
MCHC: 31 g/dL — ABNORMAL LOW (ref 31.5–35.7)
MCV: 80 fL (ref 79–97)
Monocytes Absolute: 1 10*3/uL — ABNORMAL HIGH (ref 0.1–0.9)
Monocytes: 8 %
Neutrophils Absolute: 8.1 10*3/uL — ABNORMAL HIGH (ref 1.4–7.0)
Neutrophils: 66 %
Platelets: 399 10*3/uL (ref 150–450)
RBC: 4.42 x10E6/uL (ref 3.77–5.28)
RDW: 16.5 % — ABNORMAL HIGH (ref 11.7–15.4)
WBC: 12.4 10*3/uL — ABNORMAL HIGH (ref 3.4–10.8)

## 2021-04-04 LAB — TSH: TSH: 1.36 u[IU]/mL (ref 0.450–4.500)

## 2021-04-07 DIAGNOSIS — G932 Benign intracranial hypertension: Secondary | ICD-10-CM | POA: Insufficient documentation

## 2021-04-07 MED ORDER — ALPRAZOLAM 0.25 MG PO TABS: ORAL_TABLET | ORAL | 0 refills | Status: DC

## 2021-04-08 ENCOUNTER — Telehealth: Payer: Self-pay | Admitting: Neurology

## 2021-04-08 NOTE — Telephone Encounter (Signed)
Sent via epic to Dr. Dalbert Garnet ph # [phone].

## 2021-04-09 ENCOUNTER — Telehealth: Payer: Self-pay | Admitting: Neurology

## 2021-04-09 NOTE — Telephone Encounter (Signed)
[facility] pending upload notes on the portal. It can take up to 14 business days to hear back.

## 2021-04-15 NOTE — Telephone Encounter (Signed)
[facility] Berkley Harvey: [id number] (exp. 04/09/21 to 06/08/21) order sent to GI. They will reach out to the patient to schedule.

## 2021-04-26 ENCOUNTER — Ambulatory Visit
Admission: RE | Admit: 2021-04-26 | Discharge: 2021-04-26 | Disposition: A | Discharge status: Home / Self Care | Payer: Medicaid Other | Source: Ambulatory Visit | Attending: Neurology | Admitting: Neurology

## 2021-04-26 DIAGNOSIS — G8929 Other chronic pain: Secondary | ICD-10-CM

## 2021-04-26 DIAGNOSIS — H4711 Papilledema associated with increased intracranial pressure: Secondary | ICD-10-CM | POA: Diagnosis not present

## 2021-04-26 DIAGNOSIS — H9190 Unspecified hearing loss, unspecified ear: Secondary | ICD-10-CM

## 2021-04-26 DIAGNOSIS — R519 Headache, unspecified: Secondary | ICD-10-CM

## 2021-04-26 DIAGNOSIS — H539 Unspecified visual disturbance: Secondary | ICD-10-CM

## 2021-04-26 DIAGNOSIS — G08 Intracranial and intraspinal phlebitis and thrombophlebitis: Secondary | ICD-10-CM

## 2021-04-26 DIAGNOSIS — H538 Other visual disturbances: Secondary | ICD-10-CM

## 2021-04-26 MED ORDER — GADOBENATE DIMEGLUMINE 529 MG/ML IV SOLN
17.0000 mL | Freq: Once | INTRAVENOUS | Status: AC | PRN
  Administered 2021-04-26: 17 mL via INTRAVENOUS

## 2021-04-30 ENCOUNTER — Telehealth: Payer: Self-pay | Admitting: *Deleted

## 2021-04-30 NOTE — Telephone Encounter (Signed)
I spoke with the patient and discussed message from Dr Lucia Gaskins. Pt aware results are consistent w/ IIH and she agrees to start Diamox. Patient aware of some of the most common side effects. She also asked if she still needed the LP. I advised she very likely will because we need opening pressure and to reduce to normal closing pressure but I will run this by Dr Lucia Gaskins for her. Pt verbalized appreciation.

## 2021-04-30 NOTE — Telephone Encounter (Signed)
-----   Message from Anson Fret, MD sent at 04/29/2021  6:49 PM EDT ----- Renee Rival: Your MRI and MRV are consistent with IDIOPATHIC INTRACRANIAL HYPERTENSION as we discussed. If ok with you, I am going to start the medication we discussed diamox(acetazolamide). Please let me know if patient agrees and I can call I prescription (FYI Dr. Fabian Sharp)

## 2021-04-30 NOTE — Telephone Encounter (Signed)
Spoke with pt. Her question about LP necessity was sent to Dr Lucia Gaskins via the phone note.

## 2021-05-01 ENCOUNTER — Other Ambulatory Visit: Payer: Self-pay | Admitting: Neurology

## 2021-05-01 NOTE — Telephone Encounter (Signed)
Spoke with patient and advised her of the plan per Dr Lucia Gaskins that we will need the LP done first to check opening pressure BEFORE she starts the Diamox. Patient verbalized understanding and appreciation for the call.

## 2021-05-03 ENCOUNTER — Ambulatory Visit
Admission: RE | Admit: 2021-05-03 | Discharge: 2021-05-03 | Disposition: A | Discharge status: Home / Self Care | Payer: Medicaid Other | Source: Ambulatory Visit | Attending: Neurology | Admitting: Neurology

## 2021-05-03 ENCOUNTER — Other Ambulatory Visit: Payer: Self-pay | Admitting: Neurology

## 2021-05-03 ENCOUNTER — Other Ambulatory Visit: Payer: Self-pay

## 2021-05-03 VITALS — BP 158/88 | HR 95

## 2021-05-03 DIAGNOSIS — H539 Unspecified visual disturbance: Secondary | ICD-10-CM

## 2021-05-03 DIAGNOSIS — H538 Other visual disturbances: Secondary | ICD-10-CM

## 2021-05-03 DIAGNOSIS — R519 Headache, unspecified: Secondary | ICD-10-CM

## 2021-05-03 DIAGNOSIS — G932 Benign intracranial hypertension: Secondary | ICD-10-CM

## 2021-05-03 DIAGNOSIS — H4711 Papilledema associated with increased intracranial pressure: Secondary | ICD-10-CM

## 2021-05-03 DIAGNOSIS — G8929 Other chronic pain: Secondary | ICD-10-CM

## 2021-05-03 LAB — CSF CELL COUNT WITH DIFFERENTIAL
RBC Count, CSF: 0 cells/uL
WBC, CSF: 1 cells/uL (ref 0–5)

## 2021-05-03 LAB — PROTEIN, CSF: Total Protein, CSF: 35 mg/dL (ref 15–45)

## 2021-05-03 LAB — GLUCOSE, CSF: Glucose, CSF: 54 mg/dL (ref 40–80)

## 2021-05-03 MED ORDER — TOPIRAMATE 50 MG PO TABS: ORAL_TABLET | ORAL | 3 refills | Status: DC

## 2021-05-03 NOTE — Discharge Instructions (Signed)

## 2021-06-17 ENCOUNTER — Telehealth: Payer: Self-pay | Admitting: Neurology

## 2021-06-17 ENCOUNTER — Institutional Professional Consult (permissible substitution): Payer: Medicaid Other | Admitting: Neurology

## 2021-06-17 NOTE — Telephone Encounter (Signed)
LVM & snet mychart msg informing pt of sleep consult cancellation- MD out.

## 2021-06-18 ENCOUNTER — Other Ambulatory Visit: Payer: Self-pay

## 2021-06-18 ENCOUNTER — Ambulatory Visit: Payer: Medicaid Other | Admitting: Neurology

## 2021-06-18 ENCOUNTER — Encounter: Payer: Self-pay | Admitting: Neurology

## 2021-06-18 VITALS — BP 130/90 | HR 69 | Ht <= 58 in | Wt 184.0 lb

## 2021-06-18 DIAGNOSIS — G932 Benign intracranial hypertension: Secondary | ICD-10-CM | POA: Diagnosis not present

## 2021-06-18 MED ORDER — TOPIRAMATE ER 100 MG PO CAP24: 100.0000 mg | ORAL_CAPSULE | Freq: Every evening | ORAL | 11 refills | Status: DC

## 2021-06-18 NOTE — Progress Notes (Signed)
[facility]    Provider:  Dr Lucia Gaskins Requesting Provider: Fabian Sharp MD Primary Care Provider:  [facility]  CC:  papilledema  06/18/2021: Imaging was c/w IDIOPATHIC INTRACRANIAL HYPERTENSION. She was started on diamox and referred to Dr. Dione Booze for eye exam. Had side effects with Diamox, better on Topiramate.Opening pressure was 30. Headaches are better. Vision is better. She is going ot make an appointment for eyes. Referred to [facility]. Hearing changes gone. Headaches gone. No vision changes. Waiting on [facility].  04/26/2021: MRI/MRV brain: IMPRESSION: pesonally reviewed images and agree: This MRI of the brain with and without contrast shows the following: 1.   Reduced pituitary height and a normal sized sella turcica consistent with a partially empty sella and widened optic nerve studies.  These findings are nonspecific and could be incidental or due to elevated intracranial pressure. 2.   Brain parenchyma appears normal. 3.   No acute findings.  Normal enhancement pattern. IMPRESSION: This MR venogram of the intracranial veins and sinuses shows the following: 1.   Relative flow voids are noted at the junction of the transverse sinuses and sigmoid sinuses bilaterally.  This is a nonspecific finding but is common with elevated intracranial pressure as would be seen with idiopathic intracranial hypertension.  No evidence of thrombosis in the superior sagittal or other sinuses. 2.   Asymmetry of the transverse and sigmoid sinuses bilaterally, more diminutive on the left.  Asymmetry is a normal variant.  Reviewe LP report, opening pressure elevated at 20  Patient complains of symptoms per HPI as well as the following symptoms: obesity . Pertinent negatives and positives per HPI. All others negative   HPI:  Judy Taylor is a 35 y.o. female here as requested by [facility]* for papilledema.  Reviewed Dr. Laruth Bouchard notes: nerve photos show 270 degree of margin blurring, no vessel obscuration, OCT show symmetric edema OU stable from 3 months ago.  She was told she had swelling behind her eyes. She actually saw Dr. Pearlean Brownie in the past, papilledema was mentioned back then, MRI brain and MRV were normal, she did not have the lumbar puncture completed then her topiramate was started by psychiatry and she got better. She has tried to lose weight. Feels like a tiny person behind her eye, going on for years, sometimes in the back of her head, sometimes pointed towards the right side, 2-3x a week, lasts "a while" but can be dull all day long, can be severeshe has hearing changes. She is supposed to wear glasses, her vision is a little blurry. She snores, she is very tired, she falls asleep during the day. No other focal neurologic deficits, associated symptoms, inciting events or modifiable factors.  Reviewed notes, labs and imaging from outside physicians, which showed:  Reviewed Dr. Laruth Bouchard notes: nerve photos show 270 degree of margin blurring, no vessel obscuration, OCT show symmetric edema OU stable from 3 months ago.  MRI/MRV of the brain in 2015 were normal, rviewed imaging and agree  Review of Systems: Patient complains of symptoms per HPI as well as the following symptoms sleepiness. Pertinent negatives and positives per HPI. All others negative.   Social History   Socioeconomic History   Marital status: Married    Spouse name: Not on file   Number of children: 2   Years of education: 12th   Highest education level: Not on file  Occupational History   Occupation: N/A  Tobacco Use   Smoking status: Some Days    Types: Cigarettes   Smokeless tobacco: Never  Vaping Use   Vaping Use: Never used  Substance and Sexual Activity   Alcohol use: Not Currently   Drug use: No   Sexual activity: Yes    Birth control/protection: Surgical  Other Topics Concern   Not on file  Social  History Narrative    Patient is married with 2 children.   Patient is right handed   Patient has hs education.   Patient drinks around 5 sodas daily.   Social Determinants of Health   Financial Resource Strain: Not on file  Food Insecurity: Not on file  Transportation Needs: Not on file  Physical Activity: Not on file  Stress: Not on file  Social Connections: Not on file  Intimate Partner Violence: Not on file    Family History  Problem Relation Age of Onset   High blood pressure Mother    Heart attack Maternal Grandmother    Dementia Paternal Grandmother     Past Medical History:  Diagnosis Date   Anxiety    Asthma    Depression    GERD (gastroesophageal reflux disease)    Headache    History of COVID-19 03/05/2021   Hypertension    Panic disorder    Papilledema    PONV (postoperative nausea and vomiting)     Patient Active Problem List   Diagnosis Date Noted   IIH (idiopathic intracranial hypertension) 04/07/2021   Papilloedema, 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/09/2014   Pseudotumor cerebri 03/09/2014   Obesity, 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/09/2014   Headache(784.0) 03/09/2014    Past Surgical History:  Procedure Laterality Date   CARPAL TUNNEL RELEASE Right 03/26/2021   Procedure: RIGHT CARPAL TUNNEL RELEASE;  Surgeon: Cindee Salt, MD;  Location: [facility];  Service: Orthopedics;  Laterality: Right;   CESAREAN SECTION     CESAREAN SECTION     x 3   TUBAL LIGATION      Current Outpatient Medications  Medication Sig Dispense Refill   albuterol (VENTOLIN HFA) 108 (90 Base) MCG/ACT inhaler Inhale 2 puffs into the lungs every 6 (six) hours as needed for wheezing or shortness of breath.     Cetirizine HCl (ZYRTEC PO) Take 10 mg by mouth.     clonazePAM (KLONOPIN) 0.5 MG tablet Take 0.5 mg by mouth daily as needed for anxiety.     fluticasone (FLONASE) 50 MCG/ACT nasal spray Place 1 spray into both nostrils as needed.   0   omeprazole (PRILOSEC) 40 MG capsule Take 40 mg by  mouth daily.     Topiramate ER (TROKENDI XR) 100 MG CP24 Take 100 mg by mouth at bedtime. 30 capsule 11   No current facility-administered medications for this visit.    Allergies as of 06/18/2021 - Review Complete 06/18/2021  Allergen Reaction Noted   Bactrim [sulfamethoxazole-trimethoprim] Rash 03/09/2014   Latex Rash 03/19/2021   Wound dressing adhesive Rash 03/19/2021    Vitals: BP 130/90   Pulse 69   Ht 4\' 10"  (1.473 m)   Wt 184 lb (83.5 kg)   BMI 38.46 kg/m  Last Weight:  Wt Readings from Last 1 Encounters:  06/18/21 184 lb (83.5 kg)   Last Height:   Ht Readings from Last 1 Encounters:  06/18/21 4\' 10"  (1.473 m)    Physical exam: Exam: Gen: NAD, conversant, well nourised, obese, well groomed  CV: RRR, no MRG. No Carotid Bruits. No peripheral edema, warm, nontender Eyes: Conjunctivae clear without exudates or hemorrhage  Neuro: Detailed Neurologic Exam  Speech:    Speech is normal; fluent and spontaneous with normal comprehension.  Cognition:    The patient is oriented to person, place, and time;     recent and remote memory intact;     language fluent;     normal attention, concentration,     fund of knowledge Cranial Nerves:    The pupils are equal, round, and reactive to light. Papilledema improved OU. Visual fields are full to finger confrontation. Extraocular movements are intact. Trigeminal sensation is intact and the muscles of mastication are normal. The face is symmetric. The palate elevates in the midline. Hearing intact. Voice is normal. Shoulder shrug is normal. The tongue has normal motion without fasciculations.   Coordination:    Normal finger to nose and heel to shin. Normal rapid alternating movements.   Gait:    Heel-toe and tandem gait are normal.   Motor Observation:    No asymmetry, no atrophy, and no involuntary movements noted. Tone:    Normal muscle tone.    Posture:    Posture is normal. normal erect     Strength:    Strength is V/V in the upper and lower limbs.      Sensation: intact to LT     Reflex Exam:  DTR's:    Deep tendon reflexes in the upper and lower extremities are normal bilaterally.   Toes:    The toes are downgoing bilaterally.   Clonus:    Clonus is absent.    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 Reviewed Dr. Laruth Bouchard notes: nerve photos show 270 degree of margin blurring, no vessel obscuration, OCT shows symmetric edema OU stable from 3 months ago. She also presents with headaches, Topiramate in the past helped, hearing changes, blurry vision and vision changes. Also need to evaluate for sleep apnea.   MRI brain MRV head: c/w IDIOPATHIC INTRACRANIAL HYPERTENSION  Lumbar puncture(spinal tap): OP 30 Doing well on Topiramate, having side effects, switch to 100mg  ER Dr. for sleep evaluation [facility], pending Discussed risk of permanent vision loss, go to ED for any vision worsening Discussed weight loss. She does not have an IUD or hx of medication associated with IDIOPATHIC INTRACRANIAL HYPERTENSION   Meds ordered this encounter  Medications   Topiramate ER (TROKENDI XR) 100 MG CP24    Sig: Take 100 mg by mouth at bedtime.    Dispense:  30 capsule    Refill:  11     Cc: [place], [facility]*,  Groat,Scott MD  [place] heights, MD  [facility] [address] [place], [place]  Phone [phone] Fax [phone]  I spent 30 minutes of face-to-face and non-face-to-face time with patient on the  1. IIH (idiopathic intracranial hypertension)    diagnosis.  This included previsit chart review, lab review, study review, order entry, electronic health record documentation, patient education on the different diagnostic and therapeutic options, counseling and coordination of care, risks and benefits of management, compliance, or risk factor  reduction

## 2021-06-18 NOTE — Patient Instructions (Signed)
Topiramate Extended-Release Capsules What is this medication? TOPIRAMATE (toe PYRE a mate) prevents and controls seizures in people with epilepsy. It may also be used to prevent migraine headaches. It works by calming overactive nerves in your body. This medicine may be used for other purposes; ask your health care provider or pharmacist if you have questions. COMMON BRAND NAME(S): Trokendi XR What should I tell my care team before I take this medication? They need to know if you have any of these conditions: Bleeding disorder If you often drink alcohol Kidney disease Lung disease Suicidal thoughts, plans or attempt An unusual or allergic reaction to topiramate, other medications, foods, dyes, or preservatives Pregnant or trying to get pregnant Breast-feeding How should I use this medication? Take this medication by mouth with water. Take it as directed on the prescription label at the same time every day. Do not cut, crush or chew this medication. Swallow the capsules whole. You can take it with or without food. If it upsets your stomach, take it with food. Keep taking it unless your care team tells you to stop. A special MedGuide will be given to you by the pharmacist with each prescription and refill. Be sure to read this information carefully each time. Talk to your care team about the use of this medication in children. While it may be prescribed for children as young as 6 years for selected conditions, precautions do apply. Overdosage: If you think you have taken too much of this medicine contact a poison control center or emergency room at once. NOTE: This medicine is only for you. Do not share this medicine with others. What if I miss a dose? If you miss a dose, take it as soon as you can. If it is almost time for your next dose, take only that dose. Do not take double or extra doses. What may interact with this medication? Acetazolamide Alcohol Antihistamines for allergy, cough,  and cold Aspirin and aspirin-like medications Atropine Birth control pills Certain medications for anxiety or sleep Certain medications for bladder problems like oxybutynin, tolterodine Certain medications for depression like amitriptyline, fluoxetine, sertraline Certain medications for Parkinson's disease like benztropine, trihexyphenidyl Certain medications for seizures like carbamazepine, lamotrigine, phenobarbital, phenytoin, primidone, valproic acid, zonisamide Certain medications for stomach problems like dicyclomine, hyoscyamine Certain medications for travel sickness like scopolamine Certain medications that treat or prevent blood clots like warfarin, enoxaparin, dalteparin, apixaban, dabigatran, and rivaroxaban Digoxin Diltiazem General anesthetics like halothane, isoflurane, methoxyflurane, propofol Glyburide Hydrochlorothiazide Ipratropium Lithium Medications that relax muscles Metformin Narcotic medications for pain NSAIDs, medications for pain and inflammation, like ibuprofen or naproxen Phenothiazines like chlorpromazine, mesoridazine, prochlorperazine, thioridazine Pioglitazone This list may not describe all possible interactions. Give your health care provider a list of all the medicines, herbs, non-prescription drugs, or dietary supplements you use. Also tell them if you smoke, drink alcohol, or use illegal drugs. Some items may interact with your medicine. What should I watch for while using this medication? Visit your care team for regular checks on your progress. Tell your care team if your symptoms do not start to get better or if they get worse. Do not suddenly stop taking this medication. You may develop a severe reaction. Your care team will tell you how much medication to take. If your care team wants you to stop the medication, the dose may be slowly lowered over time to avoid any side effects. Wear a medical ID bracelet or chain. Carry a card that describes  your condition. List  the medications and doses you take on the card. You may get drowsy or dizzy. Do not drive, use machinery, or do anything that needs mental alertness until you know how this medication affects you. Do not stand up or sit up quickly, especially if you are an older patient. This reduces the risk of dizzy or fainting spells. Alcohol may interfere with the effects of this medication. Avoid alcoholic drinks. This medication may cause serious skin reactions. They can happen weeks to months after starting the medication. Contact your care team right away if you notice fevers or flu-like symptoms with a rash. The rash may be red or purple and then turn into blisters or peeling of the skin. Or, you might notice a red rash with swelling of the face, lips or lymph nodes in your neck or under your arms. Watch for new or worsening thoughts of suicide or depression. This includes sudden changes in mood, behaviors, or thoughts. These changes can happen at any time but are more common in the beginning of treatment or after a change in dose. Call your care team right away if you experience these thoughts or worsening depression. This medication may slow your child's growth if it is taken for a long time at high doses. Your care team will monitor your child's growth. Using this medication for a long time may weaken your bones. The risk of bone fractures may be increased. Talk to your care team about your bone health. Do not become pregnant while taking this medication. Hormone forms of birth control may not work as well with this medication. Talk to your care team about other forms of birth control. There is potential for serious harm to an unborn child. Tell your care team right away if you think you might be pregnant. What side effects may I notice from receiving this medication? Side effects that you should report to your care team as soon as possible: Allergic reactions-skin rash, itching, hives,  swelling of the face, lips, tongue, or throat High acid level-trouble breathing, unusual weakness or fatigue, confusion, headache, fast or irregular heartbeat, nausea, vomiting High ammonia level-unusual weakness or fatigue, confusion, loss of appetite, nausea, vomiting, seizures Fever that does not go away, decrease in sweat Kidney stones-blood in the urine, pain or trouble passing urine, pain in the lower back or sides Redness, blistering, peeling or loosening of the skin, including inside the mouth Sudden eye pain or change in vision, such as blurry vision, seeing halos around lights, vision loss Thoughts of suicide or self-harm, worsening mood, feelings of depression Side effects that usually do not require medical attention (report to your care team if they continue or are bothersome): Burning or tingling sensation in hands or feet Difficulty with paying attention, memory, or speech Dizziness Drowsiness Fatigue Loss of appetite with weight loss Slow or sluggish movements of the body This list may not describe all possible side effects. Call your doctor for medical advice about side effects. You may report side effects to FDA at 1-800-FDA-1088. Where should I keep my medication? Keep out of the reach of children and pets. Store at room temperature between 20 and 25 degrees C (68 and 77 degrees F). Protect from light and moisture. Keep the container tightly closed. Get rid of any unused medication after the expiration date. To get rid of medications that are no longer needed or have expired: Take the medication to a medication take-back program. Check with your pharmacy or law enforcement to find a location.  If you cannot return the medication, check the label or package insert to see if the medication should be thrown out in the garbage or flushed down the toilet. If you are not sure, ask your care team. If it is safe to put it in the trash, empty the medication out of the container. Mix  the medication with cat litter, dirt, coffee grounds, or other unwanted substance. Seal the mixture in a bag or container. Put it in the trash. NOTE: This sheet is a summary. It may not cover all possible information. If you have questions about this medicine, talk to your doctor, pharmacist, or health care provider.  2022 Elsevier/Gold Standard (2020-09-25 12:41:35)

## 2021-08-07 ENCOUNTER — Ambulatory Visit: Payer: Medicaid Other | Admitting: Neurology

## 2021-08-07 ENCOUNTER — Encounter: Payer: Self-pay | Admitting: Neurology

## 2021-08-07 VITALS — BP 133/85 | HR 79 | Ht <= 58 in | Wt 177.0 lb

## 2021-08-07 DIAGNOSIS — Z8659 Personal history of other mental and behavioral disorders: Secondary | ICD-10-CM

## 2021-08-07 DIAGNOSIS — G2581 Restless legs syndrome: Secondary | ICD-10-CM | POA: Diagnosis not present

## 2021-08-07 DIAGNOSIS — R0683 Snoring: Secondary | ICD-10-CM

## 2021-08-07 DIAGNOSIS — R519 Headache, unspecified: Secondary | ICD-10-CM

## 2021-08-07 DIAGNOSIS — G4719 Other hypersomnia: Secondary | ICD-10-CM | POA: Insufficient documentation

## 2021-08-07 NOTE — Patient Instructions (Signed)
Restless Legs Syndrome Restless legs syndrome is a condition that causes uncomfortable feelings or sensations in the legs, especially while sitting or lying down. The sensations usually cause an overwhelming urge to move the legs. The arms can also sometimes be affected. The condition can range from mild to severe. The symptoms often interfere with a person's ability to sleep. What are the causes? The cause of this condition is not known. What increases the risk? The following factors may make you more likely to develop this condition: Being older than 50. Pregnancy. Being a woman. In general, the condition is more common in women than in men. A family history of the condition. Having iron deficiency. Overuse of caffeine, nicotine, or alcohol. Certain medical conditions, such as kidney disease, Parkinson's disease, or nerve damage. Certain medicines, such as those for high blood pressure, nausea, colds, allergies, depression, and some heart conditions. What are the signs or symptoms? The main symptom of this condition is uncomfortable sensations in the legs, such as: Pulling. Tingling. Prickling. Throbbing. Crawling. Burning. Usually, the sensations: Affect both sides of the body. Are worse when you sit or lie down. Are worse at night. These may make it difficult to fall asleep. Make you have a strong urge to move your legs. Are temporarily relieved by moving your legs or standing. The arms can also be affected, but this is rare. People who have this condition often have tiredness during the day because of their lack of sleep at night. How is this diagnosed? This condition may be diagnosed based on: Your symptoms. Blood tests. In some cases, you may be monitored in a sleep lab by a specialist (a sleep study). This can detect any disruptions in your sleep. How is this treated? This condition is treated by managing the symptoms. This may include: Lifestyle changes, such as  exercising, using relaxation techniques, and avoiding caffeine, alcohol, or tobacco. Iron supplements. Medicines. Parkinson's medications may be tried first. Anti-seizure medications can also be helpful. Follow these instructions at home: General instructions Take over-the-counter and prescription medicines only as told by your health care provider. Use methods to help relieve the uncomfortable sensations, such as: Massaging your legs. Walking or stretching. Taking a cold or hot bath. Keep all follow-up visits. This is important. Lifestyle   Practice good sleep habits. For example, go to bed and get up at the same time every day. Most adults should get 7-9 hours of sleep each night. Exercise regularly. Try to get at least 30 minutes of exercise most days of the week. Practice ways of relaxing, such as yoga or meditation. Avoid caffeine and alcohol. Do not use any products that contain nicotine or tobacco. These products include cigarettes, chewing tobacco, and vaping devices, such as e-cigarettes. If you need help quitting, ask your health care provider. Where to find more information [employer] of Neurological Disorders and Stroke: [employer] Contact a health care provider if: Your symptoms get worse or they do not improve with treatment. Summary Restless legs syndrome is a condition that causes uncomfortable feelings or sensations in the legs, especially while sitting or lying down. The symptoms often interfere with your ability to sleep. This condition is treated by managing the symptoms. You may need to make lifestyle changes or take medicines. This information is not intended to replace advice given to you by your health care provider. Make sure you discuss any questions you have with your health care provider. Document Revised: 03/17/2021 Document Reviewed: 03/17/2021 Elsevier Patient Education  2022  [employer] Quality Sleep Information, Adult Quality sleep is  important for your mental and physical health. It also improves your quality of life. Quality sleep means you: Are asleep for most of the time you are in bed. Fall asleep within 30 minutes. Wake up no more than once a night.  Are awake for no longer than 20 minutes if you do wake up during the night. Most adults need 7-8 hours of quality sleep each night. How can poor sleep affect me? If you do not get enough quality sleep, you may have: Mood swings. Daytime sleepiness. Confusion. Decreased reaction time. Sleep disorders, such as insomnia and sleep apnea. Difficulty with: Solving problems. Coping with stress. Paying attention. These issues may affect your performance and productivity at work, school, and at home. Lack of sleep may also put you at higher risk for accidents, suicide, and risky behaviors. If you do not get quality sleep you may also be at higher risk for several health problems, including: Infections. Type 2 diabetes. Heart disease. High blood pressure. Obesity. Worsening of long-term conditions, like arthritis, kidney disease, depression, Parkinson's disease, and epilepsy. What actions can I take to get more quality sleep?   Stick to a sleep schedule. Go to sleep and wake up at about the same time each day. Do not try to sleep less on weekdays and make up for lost sleep on weekends. This does not work. Try to get about 30 minutes of exercise on most days. Do not exercise 2-3 hours before going to bed. Limit naps during the day to 30 minutes or less. Do not use any products that contain nicotine or tobacco, such as cigarettes or e-cigarettes. If you need help quitting, ask your health care provider. Do not drink caffeinated beverages for at least 8 hours before going to bed. Coffee, tea, and some sodas contain caffeine. Do not drink alcohol close to bedtime. Do not eat large meals close to bedtime. Do not take naps in the late afternoon. Try to get at least 30 minutes  of sunlight every day. Morning sunlight is best. Make time to relax before bed. Reading, listening to music, or taking a hot bath promotes quality sleep. Make your bedroom a place that promotes quality sleep. Keep your bedroom dark, quiet, and at a comfortable room temperature. Make sure your bed is comfortable. Take out sleep distractions like TV, a computer, smartphone, and bright lights. If you are lying awake in bed for longer than 20 minutes, get up and do a relaxing activity until you feel sleepy. Work with your health care provider to treat medical conditions that may affect sleeping, such as: Nasal obstruction. Snoring. Sleep apnea and other sleep disorders. Talk to your health care provider if you think any of your prescription medicines may cause you to have difficulty falling or staying asleep. If you have sleep problems, talk with a sleep consultant. If you think you have a sleep disorder, talk with your health care provider about getting evaluated by a specialist. Where to find more information National Sleep Foundation website: https://sleepfoundation.org National Heart, Lung, and Blood Institute (NHLBI): https://hall.info/.pdf Centers for Disease Control and Prevention (CDC): [employer] Contact a health care provider if you: Have trouble getting to sleep or staying asleep. Often wake up very early in the morning and cannot get back to sleep. Have daytime sleepiness. Have daytime sleep attacks of suddenly falling asleep and sudden muscle weakness (narcolepsy). Have a tingling sensation in your legs with a strong urge to move  your legs (restless legs syndrome). Stop breathing briefly during sleep (sleep apnea). Think you have a sleep disorder or are taking a medicine that is affecting your quality of sleep. Summary Most adults need 7-8 hours of quality sleep each night. Getting enough quality sleep is an important part of  health and well-being. Make your bedroom a place that promotes quality sleep and avoid things that may cause you to have poor sleep, such as alcohol, caffeine, smoking, and large meals. Talk to your health care provider if you have trouble falling asleep or staying asleep. This information is not intended to replace advice given to you by your health care provider. Make sure you discuss any questions you have with your health care provider. Document Revised: 11/11/2017 Document Reviewed: 11/11/2017 Elsevier Patient Education  2022 [employer].

## 2021-08-07 NOTE — Progress Notes (Signed)
SLEEP MEDICINE CLINIC    Provider:  Melvyn Novas, MD  Primary Care Physician:  [facility] [address] [place] [address]     Referring Provider: Anson Fret, Md [address] [place],  [place] [address]          Chief Complaint according to patient   Patient presents with:     New Patient (Initial Visit)           HISTORY OF PRESENT ILLNESS:  Judy Taylor is a 35 y.o. female patient seen here as In a sleep medicine consultation on 08/07/2021 from Dr Lucia Gaskins.  Chief concern according to patient : 08-07-2021:  Internal referral from Dr. Lucia Gaskins for morning HA and daytime somnolence. No prior ss. Pt reports constantly being tired and sleepy. Pt doesn't feel sad or depressed, just tired. Wakes up at night to use the restroom. Has woken herself up from snoring.   She wakes up with headaches, has been treated for benign intracranial hypertension. HA have improved.  She is not woken by headaches, but by SNORING.     MAUDIE SHINGLEDECKER  has a past medical history of Anxiety, Asthma, Depression, GERD (gastroesophageal reflux disease), Headache, History of COVID-19 (03/05/2021), Hypertension, Panic disorder, Papilledema, and PONV (postoperative nausea and vomiting).  Covid 19 was treated with medication, not hospitalized has been vaccinated. Patient mentioned anemia and RLS concerns. Morning headaches. EDS.     Sleep relevant medical history: Nocturia 0-2 times, Sleep walking in her youth, sleep eating,no Tonsillectomy, no sinus surgery, cervical spine surgery/no deviated septum repair. Wisdom teeth removed.     Family medical /sleep history: father with OSA, sleep walkers.    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 Pets are  present. 2 dogs and 2 cats.  Tobacco use: vaping .   ETOH use ; none , Caffeine intake in form of Coffee( 1 cup) Soda( decaffeinated). Regular exercise  ; none , too fatigued .      Sleep habits are as follows: The patient's dinner time is between 6  PM. The patient goes to bed at 9-10 PM and continues to sleep for intervals of 3 hours, wakes for one bathroom breaks.   The preferred sleep position is prone, supine , with the support of 2 pillows.  Dreams are reportedly rare/ infrequent.   6 AM is the usual rise time. The patient wakes up with an alarm. She reports not feeling refreshed or restored in AM, with symptoms such as dry mouth, morning headaches, and residual fatigue.   Naps are taken frequently, she does fall asleep, irresistible urge lasting from 40-60  minutes and are less refreshing than nocturnal sleep.    Review of Systems: Out of a complete 14 system review, the patient complains of only the following symptoms, and all other reviewed systems are negative.:  Fatigue, sleepiness , snoring, fragmented sleep,  history sleep walking.  Averages 6 hours of sleep.    How likely are you to doze in the following situations: 0 = not likely, 1 = slight chance, 2 = moderate chance, 3 = high chance   Sitting and Reading? Watching Television? Sitting inactive in a public place (theater or meeting)? As a passenger in a car for an hour without a break? Lying down in the afternoon when circumstances permit? Sitting and talking to  someone? Sitting quietly after lunch without alcohol? In a car, while stopped for a few minutes in traffic?   Total = 15/ 24 points   FSS endorsed at na/ 63 points.   PQR depression score not answered.   Social History   Socioeconomic History   Marital status: Married    Spouse name: Not on file   Number of children: 2   Years of education: 12th   Highest education level: Not on file  Occupational History   Occupation: N/A  Tobacco Use   Smoking status: Some Days    Types: Cigarettes   Smokeless tobacco: Never  Vaping Use   Vaping Use: Never used  Substance and Sexual Activity   Alcohol use:  Not Currently   Drug use: No   Sexual activity: Yes    Birth control/protection: Surgical  Other Topics Concern   Not on file  Social History Narrative    Patient is married with 2 children.   Patient is right handed   Patient has hs education.   Patient drinks around 5 sodas daily.   Social Determinants of Health   Financial Resource Strain: Not on file  Food Insecurity: Not on file  Transportation Needs: Not on file  Physical Activity: Not on file  Stress: Not on file  Social Connections: Not on file    Family History  Problem Relation Age of Onset   High blood pressure Mother    Heart attack Maternal Grandmother    Dementia Paternal Grandmother     Past Medical History:  Diagnosis Date   Anxiety    Asthma    Depression    GERD (gastroesophageal reflux disease)    Headache    History of COVID-19 03/05/2021   Hypertension    Panic disorder    Papilledema    PONV (postoperative nausea and vomiting)     Past Surgical History:  Procedure Laterality Date   CARPAL TUNNEL RELEASE Right 03/26/2021   Procedure: RIGHT CARPAL TUNNEL RELEASE;  Surgeon: Cindee Salt, MD;  Location: [facility];  Service: Orthopedics;  Laterality: Right;   CESAREAN SECTION     CESAREAN SECTION     x 3   TUBAL LIGATION       Current Outpatient Medications on File Prior to Visit  Medication Sig Dispense Refill   albuterol (VENTOLIN HFA) 108 (90 Base) MCG/ACT inhaler Inhale 2 puffs into the lungs every 6 (six) hours as needed for wheezing or shortness of breath.     Cetirizine HCl (ZYRTEC PO) Take 10 mg by mouth.     clonazePAM (KLONOPIN) 0.5 MG tablet Take 0.5 mg by mouth 2 (two) times daily as needed for anxiety.     fluticasone (FLONASE) 50 MCG/ACT nasal spray Place 1 spray into both nostrils as needed.   0   omeprazole (PRILOSEC) 40 MG capsule Take 40 mg by mouth daily.     Topiramate ER (TROKENDI XR) 100 MG CP24 Take 100 mg by mouth at bedtime. 30 capsule 11   No current  facility-administered medications on file prior to visit.    Allergies  Allergen Reactions   Bactrim [Sulfamethoxazole-Trimethoprim] Rash   Latex Rash   Wound Dressing Adhesive Rash    Physical exam:  Today's Vitals   08/07/21 0959  BP: 133/85  Pulse: 79  Weight: 177 lb (80.3 kg)  Height: 4\' 10"  (1.473 m)   Body mass index is 36.99 kg/m.   Wt Readings from Last 3 Encounters:  08/07/21 177  lb (80.3 kg)  06/18/21 184 lb (83.5 kg)  04/03/21 179 lb 9.6 oz (81.5 kg)     Ht Readings from Last 3 Encounters:  08/07/21  (1.473 m)  06/18/21  (1.473 m)  04/03/21 4' 10.5" (1.486 m)      General: The patient is awake, alert and appears not in acute distress. The patient is well groomed. Head: Normocephalic, atraumatic. Neck is supple.  Mallampati 2,  neck circumference:15 inches . Nasal airflow seasonal patent.  uses decongestants  Retrognathia is  seen.  Dental status: biological  Cardiovascular:  Regular rate and cardiac rhythm by pulse,  without distended neck veins. Respiratory: Lungs are clear to auscultation.  Skin:  Without evidence of ankle edema, or rash. Trunk: The patient's posture is erect.   Neurologic exam : The patient is awake and alert, oriented to place and time.   Memory subjective described as intact.  Attention span & concentration ability appears normal.  Speech is fluent,  without  dysarthria, dysphonia or aphasia.  Mood and affect are appropriate.   Cranial nerves: no loss of smell or taste reported  Pupils are equal and briskly reactive to light. Funduscopic exam deferred. .  Extraocular movements in vertical and horizontal planes were intact and without nystagmus. No Diplopia. Visual fields by finger perimetry are intact. Hearing was intact to soft voice and finger rubbing.    Facial sensation intact to fine touch.  Facial motor strength is symmetric and tongue and uvula move midline.  Neck ROM : rotation, tilt and flexion extension  were normal for age and shoulder shrug was symmetrical.    Motor exam:  Symmetric bulk, tone and ROM.   Normal tone without cog wheeling, symmetric grip strength .   Sensory:  Fine touch, pinprick and vibration were tested  and  normal.  Proprioception tested in the upper extremities was normal.   Coordination: Rapid alternating movements in the fingers/hands were of normal speed.  The Finger-to-nose maneuver was intact without evidence of ataxia, dysmetria or tremor.   Gait and station: Patient could rise unassisted from a seated position, walked without assistive device.  Stance is of normal width/ base and the patient turned with 3 steps.  Toe and heel walk were deferred.  Deep tendon reflexes: in the  upper and lower extremities are symmetric and intact.  Babinski response was deferred.       After spending a total time of  45  minutes face to face and additional time for physical and neurologic examination, review of laboratory studies,  personal review of imaging studies, reports and results of other testing and review of referral information / records as far as provided in visit, I have established the following assessments:  1) patient with history of benign intracranial hypertension, still waking up with HA, not cluster, not migraine type.  2) witnessed loud snoring , correlating to elevated BMI 37. 3) fragmented, non restorative sleep, excessive daytime sleepiness.  4) excessive fatigue.  5) RLS   Clonazepam is used for anxiety treatment and can suppress REM sleep and increase snoring and apnea.    My Plan is to proceed with:  1) Can have in- lab SPLIT study at AHI 10 or a HST to screen for apnea.  2) RLS may correlated to PLMs, needs attended sleep study.   I would like to thank [facility] and Anson Fret, Md [address] [place],  [place] [address] for allowing me  to meet with and to take care of this pleasant patient.   In short,  SHY GUALLPA is presenting with RLS, snoring and EDS , I plan to follow up either personally or through our NP within 2-4  months.   CC: I will share my notes with PCP Marland Kitchen Lajuana Matte, NP)  and Dr Naomie Dean, MD .  Electronically signed by: Melvyn Novas, MD 08/07/2021 10:49 AM  [facility] Sleep Board certified by The [employer] of Sleep Medicine and Diplomate of the [employer] of Sleep Medicine. Board certified In Neurology through the ABPN, Fellow of the [employer] of Neurology. Medical Director of [employer].

## 2021-09-24 ENCOUNTER — Ambulatory Visit (INDEPENDENT_AMBULATORY_CARE_PROVIDER_SITE_OTHER): Payer: Medicaid Other | Admitting: Neurology

## 2021-09-24 DIAGNOSIS — G4710 Hypersomnia, unspecified: Secondary | ICD-10-CM

## 2021-09-24 DIAGNOSIS — G2581 Restless legs syndrome: Secondary | ICD-10-CM

## 2021-09-24 DIAGNOSIS — R0683 Snoring: Secondary | ICD-10-CM

## 2021-09-24 DIAGNOSIS — G4719 Other hypersomnia: Secondary | ICD-10-CM

## 2021-09-24 DIAGNOSIS — Z8659 Personal history of other mental and behavioral disorders: Secondary | ICD-10-CM

## 2021-09-24 DIAGNOSIS — R519 Headache, unspecified: Secondary | ICD-10-CM

## 2021-10-10 NOTE — Progress Notes (Signed)
IMPRESSION: Few spontaneous arousals, no physiological sleep disorder identified.    1. No evidence of snoring, hypoxia, or Obstructive Sleep Apnea (OSA) 2. No evidence of Periodic Limb Movement Disorder (PLMD) 3. Normal EKG 4. REM sleep onset was delayed, but REM sleep increased in proportion over the last 2 hours of sleep.   RECOMMENDATIONS:  1. No physiologic sleep disorder identified. I was especially surprised to find no snoring in this recording.

## 2021-10-10 NOTE — Procedures (Signed)
PATIENT'S NAME:  Judy Taylor, Judy Taylor DOB:      1986-05-29      MR#:    [id number]     DATE OF RECORDING: 09/24/2021 [place] REFERRING M.D.:  Anson Fret MD Study Performed:   Baseline Polysomnogram HISTORY:  Judy Taylor is a 36 y.o. female patient seen on 08-07-2021:  Internal referral from Dr. Lucia Gaskins for morning HA and daytime somnolence. Pt reports constantly being tired and sleepy. Pt doesn't feel sad or depressed, just tired. Wakes up at night to use the restroom. Has woken herself up from snoring.   She wakes up with headaches, has been treated for benign intracranial hypertension. HA have improved.  She is not woken by headaches, but by SNORING.    Judy Taylor has a medical history of Anxiety, Asthma, Depression, GERD (gastroesophageal reflux disease), Headache, History of COVID-19 (03/05/2021), Hypertension, Panic disorder, Papilledema, and PONV (postoperative nausea and vomiting).  Covid 19 was treated with medication, not hospitalized has been vaccinated. Patient mentioned anemia and RLS concerns. Morning headaches. EDS. Sleep relevant medical history: Nocturia 0-2 times, Sleep walking in her youth, sleep-eating, Wisdom teeth removed.  The patient endorsed the Epworth Sleepiness Scale at 15 points.   The patient's weight 177 pounds with a height of 58 (inches), resulting in a BMI of 37. kg/m2. The patient's neck circumference measured 15 inches.  CURRENT MEDICATIONS: Ventolin HFA, Zyrtec, Klonopin, Flonase, Prilosec, Trokendi XR   PROCEDURE:  This is a multichannel digital polysomnogram utilizing the Somnostar 11.2 system.  Electrodes and sensors were applied and monitored per AASM Specifications.   EEG, EOG, Chin and Limb EMG, were sampled at 200 Hz.  ECG, Snore and Nasal Pressure, Thermal Airflow, Respiratory Effort, CPAP Flow and Pressure, Oximetry was sampled at 50 Hz. Digital video and audio were recorded.      BASELINE STUDY: Lights Out was at 21:50 and Lights On at  05:27.  Total recording time (TRT) was 458 minutes, with a total sleep time (TST) of 406.5 minutes.   The patient's sleep latency was 11.5 minutes.  REM latency was 182 minutes.  The sleep efficiency was 88.8 %.     SLEEP ARCHITECTURE: WASO (Wake after sleep onset) was 38 minutes.  There were 10.5 minutes in Stage N1, 256.5 minutes Stage N2, 63.5 minutes Stage N3 and 76 minutes in Stage REM.  The percentage of Stage N1 was 2.6%, Stage N2 was 63.1%, Stage N3 was 15.6% and Stage R (REM sleep) was 18.7%.   RESPIRATORY ANALYSIS:  There were a total of 5 respiratory events:  1 obstructive apnea, 0 central apneas and 4 hypopneas with a hypopnea index of .6 /hour.  The total APNEA/HYPOPNEA INDEX (AHI) was 0.7/hour and the total RESPIRATORY DISTURBANCE INDEX was 0 .7 /hour.  2 events occurred in REM sleep and 4 events in NREM. The REM AHI was 1.6 /hour, versus a non-REM AHI of .5. The patient spent 249.5 minutes of total sleep time in the supine position and 157 minutes in non-supine.The supine AHI was 1.2 versus a non-supine AHI of 0.0.  OXYGEN SATURATION & C02:  The Wake baseline 02 saturation was 95%, with the lowest being 85%. Time spent below 89% saturation equaled 0 minutes. The patient had a total of 10 Periodic Limb Movements.  The Periodic Limb Movement (PLM) Arousal index was 0/hour. Audio and video analysis did not show any abnormal or unusual movements, behaviors, phonations or vocalizations. Snoring was not noted. EKG was in keeping with normal  sinus rhythm (NSR).   IMPRESSION: Few spontaneous arousals, no physiological sleep disorder identified.    No evidence of snoring, hypoxia, or Obstructive Sleep Apnea (OSA) No evidence of Periodic Limb Movement Disorder (PLMD) Normal EKG REM sleep onset was delayed, but REM sleep increased in proportion over the last 2 hours of sleep.   RECOMMENDATIONS:  No physiologic sleep disorder identified. I was especially surprised to find no snoring in this  recording.   I certify that I have reviewed the entire raw data recording prior to the issuance of this report in accordance with the Standards of Accreditation of the American Academy of Sleep Medicine (AASM)   Melvyn Novas, MD Diplomat, American Board of Psychiatry and Neurology  Diplomat, American Board of Sleep Medicine Wellsite geologist, [place] [facility] [employer]

## 2021-10-14 ENCOUNTER — Encounter: Payer: Self-pay | Admitting: Neurology

## 2021-10-16 NOTE — Progress Notes (Signed)
She would not need to follow up with me.

## 2021-10-22 NOTE — Progress Notes (Deleted)
No chief complaint on file.   HISTORY OF PRESENT ILLNESS:  10/22/21 ALL:  Judy Taylor is a 36 y.o. female here today for follow up for headaches. 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 She was seen in consult with Dr Vickey Huger for reported snoring and daytime sleepiness. Split night study 09/24/2021 did not reveal any sleep breathing disorder, snoring or evidence of PLMD. Dr Lucia Gaskins referred her to [facility].    HISTORY (copied from Dr Dohmeier's previous note)  Judy Taylor is a 36 y.o. female patient seen here as In a sleep medicine consultation on 08/07/2021 from Dr Lucia Gaskins.  Chief concern according to patient : 08-07-2021:  Internal referral from Dr. Lucia Gaskins for morning HA and daytime somnolence. No prior ss. Pt reports constantly being tired and sleepy. Pt doesn't feel sad or depressed, just tired. Wakes up at night to use the restroom. Has woken herself up from snoring.    She wakes up with headaches, has been treated for benign intracranial hypertension. HA have improved.  She is not woken by headaches, but by SNORING.     Judy Taylor  has a past medical history of Anxiety, Asthma, Depression, GERD (gastroesophageal reflux disease), Headache, History of COVID-19 (03/05/2021), Hypertension, Panic disorder, Papilledema, and PONV (postoperative nausea and vomiting).  Covid 19 was treated with medication, not hospitalized has been vaccinated. Patient mentioned anemia and RLS concerns. Morning headaches. EDS.   Sleep relevant medical history: Nocturia 0-2 times, Sleep walking in her youth, sleep eating,no Tonsillectomy, no sinus surgery, cervical spine surgery/no deviated septum repair. Wisdom teeth removed.    Family medical /sleep history: father with OSA, sleep walkers.    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 Pets are  present. 2 dogs and 2 cats.  Tobacco use:  vaping .   ETOH use ; none , Caffeine intake in form of Coffee( 1 cup) Soda( decaffeinated). Regular exercise ; none , too fatigued .      Sleep habits are as follows: The patient's dinner time is between 6  PM. The patient goes to bed at 9-10 PM and continues to sleep for intervals of 3 hours, wakes for one bathroom breaks.   The preferred sleep position is prone, supine , with the support of 2 pillows.  Dreams are reportedly rare/ infrequent.   6 AM is the usual rise time. The patient wakes up with an alarm. She reports not feeling refreshed or restored in AM, with symptoms such as dry mouth, morning headaches, and residual fatigue.    Naps are taken frequently, she does fall asleep, irresistible urge lasting from 40-60  minutes and are less refreshing than nocturnal sleep.   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 She was started on diamox and referred to Dr. Dione Booze for eye exam. Had side effects with Diamox, better on Topiramate.Opening pressure was 30. Headaches are better. Vision is better. She is going ot make an appointment for eyes. [facility]erred [facility]. Hearing changes gone. Headaches gone. No vision changes. Waiting on [facility].   04/26/2021: MRI/MRV brain: IMPRESSION: pesonally reviewed images and agree: This MRI of the brain with and without contrast shows the following: 1.   Reduced pituitary height and a normal sized sella turcica consistent with a  partially empty sella and widened optic nerve studies.  These findings are nonspecific and could be incidental or due to elevated intracranial pressure. 2.   Brain parenchyma appears normal. 3.   No acute findings.  Normal enhancement pattern. IMPRESSION: This MR venogram of the intracranial veins and sinuses shows the following: 1.   Relative flow voids are noted at the junction of the transverse sinuses and sigmoid  sinuses bilaterally.  This is a nonspecific finding but is common with elevated intracranial pressure as would be seen with idiopathic intracranial hypertension.  No evidence of thrombosis in the superior sagittal or other sinuses. 2.   Asymmetry of the transverse and sigmoid sinuses bilaterally, more diminutive on the left.  Asymmetry is a normal variant.   Reviewe LP report, opening pressure elevated at 20   Patient complains of symptoms per HPI as well as the following symptoms: obesity . Pertinent negatives and positives per HPI. All others negative      REVIEW OF SYSTEMS: Out of a complete 14 system review of symptoms, the patient complains only of the following symptoms, and all other reviewed systems are negative.   ALLERGIES: Allergies  Allergen Reactions   Bactrim [Sulfamethoxazole-Trimethoprim] Rash   Latex Rash   Wound Dressing Adhesive Rash     HOME MEDICATIONS: Outpatient Medications Prior to Visit  Medication Sig Dispense Refill   albuterol (VENTOLIN HFA) 108 (90 Base) MCG/ACT inhaler Inhale 2 puffs into the lungs every 6 (six) hours as needed for wheezing or shortness of breath.     Cetirizine HCl (ZYRTEC PO) Take 10 mg by mouth.     clonazePAM (KLONOPIN) 0.5 MG tablet Take 0.5 mg by mouth 2 (two) times daily as needed for anxiety.     fluticasone (FLONASE) 50 MCG/ACT nasal spray Place 1 spray into both nostrils as needed.   0   omeprazole (PRILOSEC) 40 MG capsule Take 40 mg by mouth daily.     Topiramate ER (TROKENDI XR) 100 MG CP24 Take 100 mg by mouth at bedtime. 30 capsule 11   No facility-administered medications prior to visit.     PAST MEDICAL HISTORY: Past Medical History:  Diagnosis Date   Anxiety    Asthma    Depression    GERD (gastroesophageal reflux disease)    Headache    History of COVID-19 03/05/2021   Hypertension    Panic disorder    Papilledema    PONV (postoperative nausea and vomiting)      PAST SURGICAL HISTORY: Past Surgical  History:  Procedure Laterality Date   CARPAL TUNNEL RELEASE Right 03/26/2021   Procedure: RIGHT CARPAL TUNNEL RELEASE;  Surgeon: Cindee Salt, MD;  Location: [facility];  Service: Orthopedics;  Laterality: Right;   CESAREAN SECTION     CESAREAN SECTION     x 3   TUBAL LIGATION       FAMILY HISTORY: Family History  Problem Relation Age of Onset   High blood pressure Mother    Heart attack Maternal Grandmother    Dementia Paternal Grandmother      SOCIAL HISTORY: Social History   Socioeconomic History   Marital status: Married    Spouse name: Not on file   Number of children: 2   Years of education: 12th   Highest education level: Not on file  Occupational History   Occupation: N/A  Tobacco Use   Smoking status: Some Days    Types: Cigarettes   Smokeless tobacco: Never  Vaping Use  Vaping Use: Never used  Substance and Sexual Activity   Alcohol use: Not Currently   Drug use: No   Sexual activity: Yes    Birth control/protection: Surgical  Other Topics Concern   Not on file  Social History Narrative    Patient is married with 2 children.   Patient is right handed   Patient has hs education.   Patient drinks around 5 sodas daily.   Social Determinants of Health   Financial Resource Strain: Not on file  Food Insecurity: Not on file  Transportation Needs: Not on file  Physical Activity: Not on file  Stress: Not on file  Social Connections: Not on file  Intimate Partner Violence: Not on file     PHYSICAL EXAM  There were no vitals filed for this visit. There is no height or weight on file to calculate BMI.  Generalized: Well developed, in no acute distress  Cardiology: normal rate and rhythm, no murmur auscultated  Respiratory: clear to auscultation bilaterally    Neurological examination  Mentation: Alert oriented to time, place, history taking. Follows all commands speech and language fluent Cranial nerve II-XII: Pupils were equal  round reactive to light. Extraocular movements were full, visual field were full on confrontational test. Facial sensation and strength were normal. Uvula tongue midline. Head turning and shoulder shrug  were normal and symmetric. Motor: The motor testing reveals 5 over 5 strength of all 4 extremities. Good symmetric motor tone is noted throughout.  Sensory: Sensory testing is intact to soft touch on all 4 extremities. No evidence of extinction is noted.  Coordination: Cerebellar testing reveals good finger-nose-finger and heel-to-shin bilaterally.  Gait and station: Gait is normal. Tandem gait is normal. Romberg is negative. No drift is seen.  Reflexes: Deep tendon reflexes are symmetric and normal bilaterally.    DIAGNOSTIC DATA (LABS, IMAGING, TESTING) - I reviewed patient records, labs, notes, testing and imaging myself where available.  Lab Results  Component Value Date   WBC 12.4 (H) 04/03/2021   HGB 10.9 (L) 04/03/2021   HCT 35.2 04/03/2021   MCV 80 04/03/2021   PLT 399 04/03/2021      Component Value Date/Time   NA 135 04/03/2021 1605   K 4.4 04/03/2021 1605   CL 100 04/03/2021 1605   CO2 20 04/03/2021 1605   GLUCOSE 74 04/03/2021 1605   BUN 11 04/03/2021 1605   CREATININE 0.69 04/03/2021 1605   CALCIUM 9.7 04/03/2021 1605   PROT 7.2 04/03/2021 1605   ALBUMIN 4.9 (H) 04/03/2021 1605   AST 13 04/03/2021 1605   ALT 15 04/03/2021 1605   ALKPHOS 76 04/03/2021 1605   BILITOT <0.2 04/03/2021 1605   No results found for: CHOL, HDL, LDLCALC, LDLDIRECT, TRIG, CHOLHDL No results found for: WUJW1X No results found for: VITAMINB12 Lab Results  Component Value Date   TSH 1.360 04/03/2021    No flowsheet data found.   No flowsheet data found.   ASSESSMENT AND PLAN  36 y.o. year old female  has a past medical history of Anxiety, Asthma, Depression, GERD (gastroesophageal reflux disease), Headache, History of COVID-19 (03/05/2021), Hypertension, Panic disorder,  Papilledema, and PONV (postoperative nausea and vomiting). here with    No diagnosis found.   No orders of the defined types were placed in this encounter.    No orders of the defined types were placed in this encounter.     Shawnie Dapper, MSN, FNP-C 10/22/2021, 12:47 PM  [facility] [address] [place], [place] [address] [phone]

## 2021-10-22 NOTE — Patient Instructions (Incomplete)

## 2021-10-23 ENCOUNTER — Ambulatory Visit: Payer: Medicaid Other | Admitting: Family Medicine

## 2021-10-23 ENCOUNTER — Telehealth: Payer: Self-pay | Admitting: Neurology

## 2021-10-23 NOTE — Telephone Encounter (Signed)
Pt cancelling appt due to not feeling well °

## 2022-02-13 ENCOUNTER — Ambulatory Visit: Payer: Medicaid Other | Admitting: Family Medicine

## 2022-03-12 ENCOUNTER — Ambulatory Visit: Payer: Medicaid Other | Admitting: Neurology

## 2022-03-12 ENCOUNTER — Encounter: Payer: Self-pay | Admitting: Neurology

## 2022-03-12 VITALS — BP 109/73 | HR 69 | Ht <= 58 in | Wt 157.2 lb

## 2022-03-12 DIAGNOSIS — G932 Benign intracranial hypertension: Secondary | ICD-10-CM

## 2022-03-12 MED ORDER — TOPIRAMATE ER 100 MG PO CAP24: 100.0000 mg | ORAL_CAPSULE | Freq: Every evening | ORAL | 11 refills | Status: DC

## 2022-03-12 NOTE — Progress Notes (Signed)
[facility]    Provider:  Dr Lucia Gaskins Requesting Provider: Fabian Sharp MD Primary Care Provider:  [facility]  CC:  papilledema  7/276/2023: She is doing well. She denies any vision changes or headaches, still on topiramate, vision is doing well, she saw Dr. Dione Booze in ophtho yesterday and he said she was improved, she has lost weight, no hearing changes.   Patient complains of symptoms per HPI as well as the following symptoms: none . Pertinent negatives and positives per HPI. 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 She was started on diamox and referred to Dr. Dione Booze for eye exam. Had side effects with Diamox, better on Topiramate.Opening pressure was 30. Headaches are better. Vision is better. She is going ot make an appointment for eyes. [facility]. Hearing changes gone. Headaches gone. No vision changes. [facility]ing on [facility].  04/26/2021: MRI/MRV brain: IMPRESSION: pesonally reviewed images and agree: This MRI of the brain with and without contrast shows the following: 1.   Reduced pituitary height and a normal sized sella turcica consistent with a partially empty sella and widened optic nerve studies.  These findings are nonspecific and could be incidental or due to elevated intracranial pressure. 2.   Brain parenchyma appears normal. 3.   No acute findings.  Normal enhancement pattern. IMPRESSION: This MR venogram of the intracranial veins and sinuses shows the following: 1.   Relative flow voids are noted at the junction of the transverse sinuses and sigmoid sinuses bilaterally.  This is a nonspecific finding but is common with elevated intracranial pressure as would be seen with idiopathic intracranial hypertension.  No evidence of thrombosis in the superior sagittal or other sinuses. 2.   Asymmetry of the transverse and sigmoid sinuses  bilaterally, more diminutive on the left.  Asymmetry is a normal variant.  Reviewe LP report, opening pressure elevated at 20  Patient complains of symptoms per HPI as well as the following symptoms: obesity . Pertinent negatives and positives per HPI. All others negative   HPI:  Judy Taylor is a 36 y.o. female here as requested by [facility]* for papilledema. Reviewed Dr. Laruth Bouchard notes: nerve photos show 270 degree of margin blurring, no vessel obscuration, OCT show symmetric edema OU stable from 3 months ago.  She was told she had swelling behind her eyes. She actually saw Dr. Pearlean Brownie in the past, papilledema was mentioned back then, MRI brain and MRV were normal, she did not have the lumbar puncture completed then her topiramate was started by psychiatry and she got better. She has tried to lose weight. Feels like a tiny person behind her eye, going on for years, sometimes in the back of her head, sometimes pointed towards the right side, 2-3x a week, lasts "a while" but can be dull all day long, can be severeshe has hearing changes. She is supposed to wear glasses, her vision is a little blurry. She snores, she is very tired, she falls asleep during the day. No other focal neurologic deficits, associated symptoms, inciting events or modifiable factors.  Reviewed notes, labs and imaging from outside physicians, which showed:  Reviewed Dr. Laruth Bouchard notes: nerve photos show 270 degree of margin blurring, no vessel obscuration, OCT show symmetric edema OU stable from 3 months ago.  MRI/MRV of the brain in 2015 were normal, rviewed imaging and agree  Review of Systems: Patient complains of symptoms per  HPI as well as the following symptoms sleepiness. Pertinent negatives and positives per HPI. All others negative.   Social History   Socioeconomic History   Marital status: Married    Spouse name: Not on file   Number of children: 2   Years of education: 12th   Highest  education level: Not on file  Occupational History   Occupation: N/A  Tobacco Use   Smoking status: Some Days    Types: Cigarettes   Smokeless tobacco: Never  Vaping Use   Vaping Use: Never used  Substance and Sexual Activity   Alcohol use: Not Currently   Drug use: No   Sexual activity: Yes    Birth control/protection: Surgical  Other Topics Concern   Not on file  Social History Narrative    Patient is married with 2 children.   Patient is right handed   Patient has hs education.   Patient drinks around 5 sodas daily.   Social Determinants of Health   Financial Resource Strain: Not on file  Food Insecurity: Not on file  Transportation Needs: Not on file  Physical Activity: Not on file  Stress: Not on file  Social Connections: Not on file  Intimate Partner Violence: Not on file    Family History  Problem Relation Age of Onset   High blood pressure Mother    Heart attack Maternal Grandmother    Dementia Paternal Grandmother     Past Medical History:  Diagnosis Date   Anxiety    Asthma    Depression    GERD (gastroesophageal reflux disease)    Headache    History of COVID-19 03/05/2021   Hypertension    Panic disorder    Papilledema    PONV (postoperative nausea and vomiting)     Patient Active Problem List   Diagnosis Date Noted   RLS (restless legs syndrome) 08/07/2021   Excessive daytime sleepiness 08/07/2021   Sleep related headaches 08/07/2021   Snoring 08/07/2021   History of sleep walking 08/07/2021   IIH (idiopathic intracranial hypertension) 04/07/2021   Papilloedema, 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/09/2014   Pseudotumor cerebri 03/09/2014   Obesity, 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/09/2014   Headache(784.0) 03/09/2014    Past Surgical History:  Procedure Laterality Date   CARPAL TUNNEL RELEASE Right 03/26/2021   Procedure: RIGHT CARPAL TUNNEL RELEASE;  Surgeon: Cindee Salt, MD;  Location: [facility];  Service: Orthopedics;  Laterality: Right;   CESAREAN  SECTION     CESAREAN SECTION     x 3   TUBAL LIGATION      Current Outpatient Medications  Medication Sig Dispense Refill   albuterol (VENTOLIN HFA) 108 (90 Base) MCG/ACT inhaler Inhale 2 puffs into the lungs every 6 (six) hours as needed for wheezing or shortness of breath.     Cetirizine HCl (ZYRTEC PO) Take 10 mg by mouth.     clonazePAM (KLONOPIN) 0.5 MG tablet Take 0.5 mg by mouth 2 (two) times daily as needed for anxiety.     fluticasone (FLONASE) 50 MCG/ACT nasal spray Place 1 spray into both nostrils as needed.   0   omeprazole (PRILOSEC) 40 MG capsule Take 40 mg by mouth daily.     sertraline (ZOLOFT) 100 MG tablet Take 100 mg by mouth daily.     Topiramate ER (TROKENDI XR) 100 MG CP24 Take 100 mg by mouth at bedtime. 90 capsule 11   No current facility-administered medications for this visit.    Allergies as of 03/12/2022 - Review Complete 03/12/2022  Allergen Reaction Noted   Bactrim [sulfamethoxazole-trimethoprim] Rash 03/09/2014   Latex Rash 03/19/2021   Wound dressing adhesive Rash 03/19/2021    Vitals: BP 109/73   Pulse 69   Ht 4\' 10"  (1.473 m)   Wt 157 lb 3.2 oz (71.3 kg)   BMI 32.85 kg/m  Last Weight:  Wt Readings from Last 1 Encounters:  03/12/22 157 lb 3.2 oz (71.3 kg)   Last Height:   Ht Readings from Last 1 Encounters:  03/12/22 4\' 10"  (1.473 m)   Exam: NAD, pleasant                  Speech:    Speech is normal; fluent and spontaneous with normal comprehension.  Cognition:    The patient is oriented to person, place, and time;     recent and remote memory intact;     language fluent;    Cranial Nerves:    The pupils are equal, round, and reactive to light. Fundoscopic exam looks normal. Trigeminal sensation is intact and the muscles of mastication are normal. The face is symmetric. The palate elevates in the midline. Hearing intact. Voice is normal. Shoulder shrug is normal. The tongue has normal motion without fasciculations.   Coordination:   No dysmetria  Motor Observation:    No asymmetry, no atrophy, and no involuntary movements noted. Tone:    Normal muscle tone.     Strength:    Strength is V/V in the upper and lower limbs.      Sensation: intact to LT   Assessment/Plan:  36 year old obese patient with bilateral papilledema improved, doing well on topiramate, no signs or symptoms. Saw Dr. yesterday and reportedly good outcome. Dr. 31 previous notes: nerve photos show 270 degree of margin blurring, no vessel obscuration, OCT shows symmetric edema OU stable from 3 months ago. She also presented with headaches which are now also improved, Topiramate is helping, no hearing changes, blurry vision and vision changes.    MRI brain MRV head: c/w IDIOPATHIC INTRACRANIAL HYPERTENSION  Lumbar puncture(spinal tap): OP 30 Doing well on Topiramate, continue Dr. Dione Booze for sleep evaluation: completed Weight loss center: call again, but she has lost weight [phone] Discussed risk of permanent vision loss, go to ED for any vision worsening Discussed weight loss. She does not have an IUD or hx of medication associated with IDIOPATHIC INTRACRANIAL HYPERTENSION   Meds ordered this encounter  Medications   Topiramate ER (TROKENDI XR) 100 MG CP24    Sig: Take 100 mg by mouth at bedtime.    Dispense:  90 capsule    Refill:  11     Cc: [place], [facility]*,  Groat,Scott MD  Mason, MD  [facility] [address] [place], [address] [place]  Phone [phone] Fax [phone]  I spent 30 minutes of face-to-face and non-face-to-face time with patient on the  1. IIH (idiopathic intracranial hypertension)     diagnosis.  This included previsit chart review, lab review, study review, order entry, electronic health record documentation, patient education on the different diagnostic and therapeutic options, counseling and coordination of care, risks and benefits of management,  compliance, or risk factor reduction

## 2022-03-12 NOTE — Patient Instructions (Signed)
MRI brain MRV head: c/w IDIOPATHIC INTRACRANIAL HYPERTENSION  Lumbar puncture(spinal tap): OP 30 Doing well on Topiramate, continue Dr. Vickey Huger for sleep evaluation: completed Weight loss center: call again, but she has lost weight [phone] Discussed risk of permanent vision loss, go to ED for any vision worsening Discussed weight loss. She does not have an IUD or hx of medication associated with IDIOPATHIC INTRACRANIAL HYPERTENSION   Idiopathic Intracranial Hypertension  Idiopathic intracranial hypertension (IIH) is a condition that increases pressure around the brain. The fluid that surrounds the brain and spinal cord (cerebrospinal fluid, or CSF) increases and causes the pressure. Idiopathic means that the cause of this condition is not known. IIH affects the brain and spinal cord (neurological disorder). If this condition is not treated, it can cause vision loss or blindness. What are the causes? The cause of this condition is not known. What increases the risk? The following factors may make you more likely to develop this condition: Being very overweight (obese). Being a female between the ages of 7 and 1 years old, who has not gone through menopause. Taking certain medicines, such as birth control or steroids. What are the signs or symptoms? Symptoms of this condition include: Headaches. This is the most common symptom. Brief episodes of total blindness. Double vision, blurred vision, or poor side (peripheral) vision. Pain in the shoulders or neck. Nausea and vomiting. A sound like rushing water or a pulsing sound within the ears (pulsatile tinnitus), or ringing in the ears. How is this diagnosed? This condition may be diagnosed based on: Your symptoms and medical history. Imaging tests of the brain, such as: CT scan. MRI. Magnetic resonance venogram (MRV) to check the veins. Diagnostic lumbar puncture. This is a procedure to remove and examine a sample of cerebrospinal  fluid. This procedure can determine whether too much fluid may be causing IIH. A thorough eye exam to check for swelling or nerve damage in the eyes. How is this treated? Treatment for this condition depends on the symptoms. The goal of treatment is to decrease the pressure around your brain. Common treatments include: Weight loss through healthy eating, salt restriction, and exercise, if you are overweight. Medicines to decrease the production of spinal fluid and lower the pressure within your skull. Medicines to prevent or treat headaches. Other treatments may include: Surgery to place drains (shunts) in your brain for removing excess fluid. Lumbar puncture to remove excess cerebrospinal fluid. Follow these instructions at home: If you are overweight or obese, work with your health care provider to lose weight. Take over-the-counter and prescription medicines only as told by your health care provider. Ask your health care provider if the medicine prescribed to you requires you to avoid driving or using machinery. Do not use any products that contain nicotine or tobacco, such as cigarettes, e-cigarettes, and chewing tobacco. If you need help quitting, ask your health care provider. Keep all follow-up visits as told by your health care provider. This is important. Contact a health care provider if: You have changes in your vision, such as: Double vision. Blurred vision. Poor peripheral vision. Get help right away if: You have any of the following symptoms and they get worse or do not get better: Headaches. Nausea. Vomiting. Sudden trouble seeing. Summary Idiopathic intracranial hypertension (IIH) is a condition that increases pressure around the brain. The cause is not known (is idiopathic). The most common symptom of IIH is headaches. Vision changes, pain in the shoulders or neck, nausea, and vomiting may also  occur. Treatment for this condition depends on your symptoms. The goal of  treatment is to decrease the pressure around your brain. If you are overweight or obese, work with your health care provider to lose weight. Take over-the-counter and prescription medicines only as told by your health care provider. This information is not intended to replace advice given to you by your health care provider. Make sure you discuss any questions you have with your health care provider. Document Revised: 07/16/2019 Document Reviewed: 07/16/2019 Elsevier Patient Education  2023 [employer].

## 2022-12-10 IMAGING — XA DG SPINAL PUNCT LUMBAR DIAG WITH FL CT GUIDANCE
1 series · 1 of 1 positions shown · non-contrast
Comparison: None

CLINICAL DATA: Papilledema. Chronic intractable headache. Vision
changes/blurry vision suspected idiopathic intracranial hypertension

EXAM:
DIAGNOSTIC LUMBAR PUNCTURE UNDER FLUOROSCOPIC GUIDANCE

[Series 1: ortho adipose · 1 of 1 slices shown]
[im 1/1]
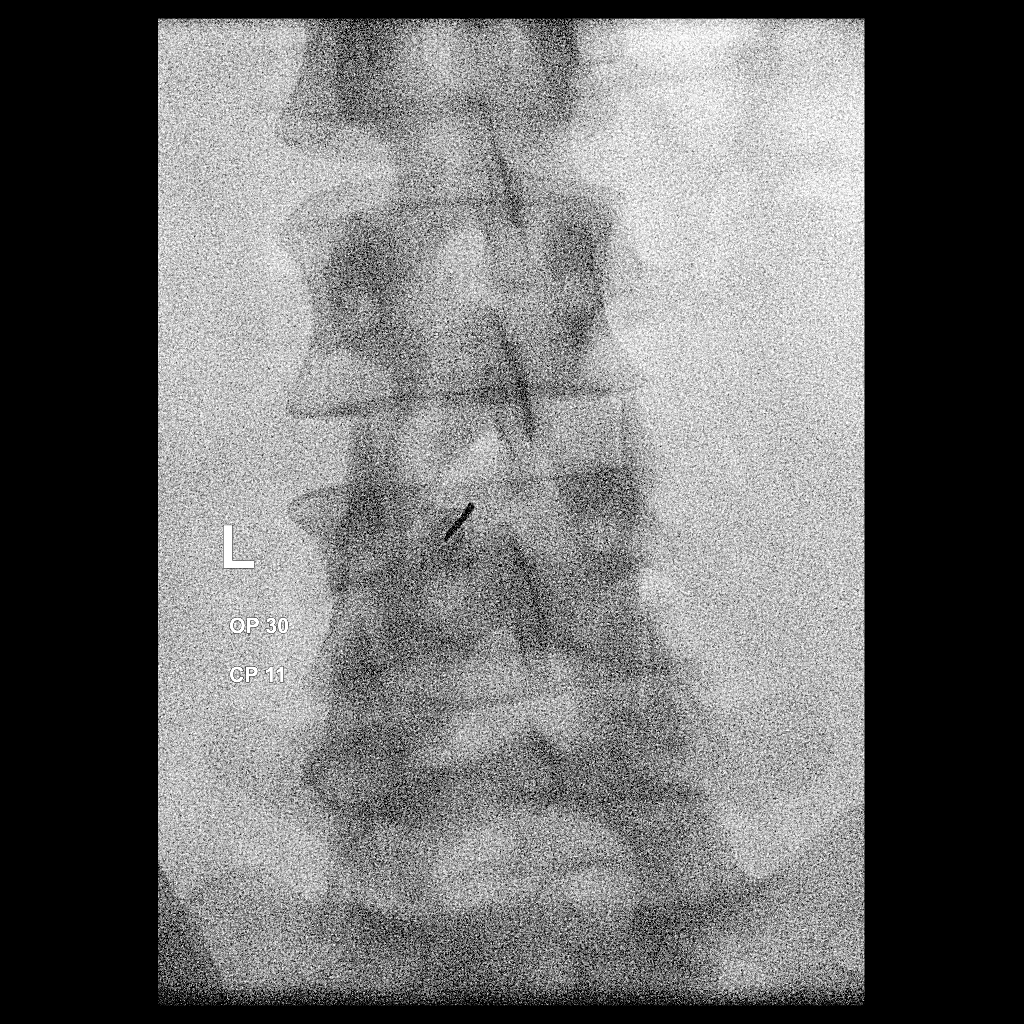

[1 of 1 positions shown; findings below may reference images not displayed]

FLUOROSCOPY TIME:  Fluoroscopy Time:  4 seconds

Radiation Exposure Index (if provided by the fluoroscopic device):
5.43 microGray*m^2

Number of Acquired Spot Images: 0

PROCEDURE:
Informed consent was obtained from the patient prior to the
procedure, including potential complications of headache, allergy,
and pain. With the patient prone, the lower back was prepped with
Betadine. 1% Lidocaine was used for local anesthesia. Lumbar
puncture was performed at the L3-4 level using a 3.5 inch 20 gauge
needle via a left interlaminar approach with return of clear CSF
with an opening pressure of 30 cm water (measured in the left
lateral decubitus position). 17 mL of CSF were obtained for
laboratory studies. Closing pressure was 11 cm water. The patient
tolerated the procedure well and there were no apparent
complications.
IMPRESSION: Technically successful fluoroscopically guided lumbar puncture.
Elevated opening pressure of 30 cm water.

## 2023-03-17 NOTE — Progress Notes (Deleted)
No chief complaint on file.   HISTORY OF PRESENT ILLNESS:  03/17/23 ALL:  Judy Taylor is a 37 y.o. female here today for follow up for IIH. She was last seen by Dr Lucia Gaskins 02/2022 and doing well on trokendi 100mg  at bedtime. Since,   Eye exam    HISTORY (copied from Dr Trevor Mace previous note)  7/276/2023: She is doing well. She denies any vision changes or headaches, still on topiramate, vision is doing well, she saw Dr. Dione Booze in ophtho yesterday and he said she was improved, she has lost weight, no hearing changes.    Patient complains of symptoms per HPI as well as the following symptoms: none . Pertinent negatives and positives per HPI. 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 She was started on diamox and referred to Dr. Dione Booze for eye exam. Had side effects with Diamox, better on Topiramate.Opening pressure was 30. Headaches are better. Vision is better. She is going ot make an appointment for eyes. Referred to [facility]. Hearing changes gone. Headaches gone. No vision changes. Waiting on [facility].   04/26/2021: MRI/MRV brain: IMPRESSION: pesonally reviewed images and agree: This MRI of the brain with and without contrast shows the following: 1.   Reduced pituitary height and a normal sized sella turcica consistent with a partially empty sella and widened optic nerve studies.  These findings are nonspecific and could be incidental or due to elevated intracranial pressure. 2.   Brain parenchyma appears normal. 3.   No acute findings.  Normal enhancement pattern. IMPRESSION: This MR venogram of the intracranial veins and sinuses shows the following: 1.   Relative flow voids are noted at the junction of the transverse sinuses and sigmoid sinuses bilaterally.  This is a nonspecific finding but is common with elevated intracranial pressure as would be seen with idiopathic intracranial  hypertension.  No evidence of thrombosis in the superior sagittal or other sinuses. 2.   Asymmetry of the transverse and sigmoid sinuses bilaterally, more diminutive on the left.  Asymmetry is a normal variant.   [facility] report, opening pressure elevated at 20   Patient complains of symptoms per HPI as well as the following symptoms: obesity . Pertinent negatives and positives per HPI. All others negative     HPI:  Judy Taylor is a 37 y.o. female here as requested by [facility] for papilledema. Reviewed Dr. Laruth Bouchard notes: nerve photos show 270 degree of margin blurring, no vessel obscuration, OCT show symmetric edema OU stable from 3 months ago.   She was told she had swelling behind her eyes. She actually saw Dr. Pearlean Brownie in the past, papilledema was mentioned back then, MRI brain and MRV were normal, she did not have the lumbar puncture completed then her topiramate was started by psychiatry and she got better. She has tried to lose weight. Feels like a tiny person behind her eye, going on for years, sometimes in the back of her head, sometimes pointed towards the right side, 2-3x a week, lasts "a while" but can be dull all day long, can be severeshe has hearing changes. She is supposed to wear glasses, her vision is a little blurry. She snores, she is very tired, she falls asleep during the day. No other focal neurologic deficits, associated symptoms, inciting events or modifiable factors.   Reviewed notes, labs and imaging from outside physicians, which showed:   Reviewed  Dr. Laruth Bouchard notes: nerve photos show 270 degree of margin blurring, no vessel obscuration, OCT show symmetric edema OU stable from 3 months ago.   MRI/MRV of the brain in 2015 were normal, rviewed imaging and agree   REVIEW OF SYSTEMS: Out of a complete 14 system review of symptoms, the patient complains only of the following symptoms, and all other reviewed systems are negative.   ALLERGIES: Allergies   Allergen Reactions   Bactrim [Sulfamethoxazole-Trimethoprim] Rash   Latex Rash   Wound Dressing Adhesive Rash     HOME MEDICATIONS: Outpatient Medications Prior to Visit  Medication Sig Dispense Refill   albuterol (VENTOLIN HFA) 108 (90 Base) MCG/ACT inhaler Inhale 2 puffs into the lungs every 6 (six) hours as needed for wheezing or shortness of breath.     Cetirizine HCl (ZYRTEC PO) Take 10 mg by mouth.     clonazePAM (KLONOPIN) 0.5 MG tablet Take 0.5 mg by mouth 2 (two) times daily as needed for anxiety.     fluticasone (FLONASE) 50 MCG/ACT nasal spray Place 1 spray into both nostrils as needed.   0   omeprazole (PRILOSEC) 40 MG capsule Take 40 mg by mouth daily.     sertraline (ZOLOFT) 100 MG tablet Take 100 mg by mouth daily.     Topiramate ER (TROKENDI XR) 100 MG CP24 Take 100 mg by mouth at bedtime. 90 capsule 11   No facility-administered medications prior to visit.     PAST MEDICAL HISTORY: Past Medical History:  Diagnosis Date   Anxiety    Asthma    Depression    GERD (gastroesophageal reflux disease)    Headache    History of COVID-19 03/05/2021   Hypertension    Panic disorder    Papilledema    PONV (postoperative nausea and vomiting)      PAST SURGICAL HISTORY: Past Surgical History:  Procedure Laterality Date   CARPAL TUNNEL RELEASE Right 03/26/2021   Procedure: RIGHT CARPAL TUNNEL RELEASE;  Surgeon: Cindee Salt, MD;  Location: [facility];  Service: Orthopedics;  Laterality: Right;   CESAREAN SECTION     CESAREAN SECTION     x 3   TUBAL LIGATION       FAMILY HISTORY: Family History  Problem Relation Age of Onset   High blood pressure Mother    Heart attack Maternal Grandmother    Dementia Paternal Grandmother      SOCIAL HISTORY: Social History   Socioeconomic History   Marital status: Married    Spouse name: Not on file   Number of children: 2   Years of education: 12th   Highest education level: Not on file   Occupational History   Occupation: N/A  Tobacco Use   Smoking status: Some Days    Types: Cigarettes   Smokeless tobacco: Never  Vaping Use   Vaping status: Never Used  Substance and Sexual Activity   Alcohol use: Not Currently   Drug use: No   Sexual activity: Yes    Birth control/protection: Surgical  Other Topics Concern   Not on file  Social History Narrative    Patient is married with 2 children.   Patient is right handed   Patient has hs education.   Patient drinks around 5 sodas daily.   Social Determinants of Health   Financial Resource Strain: Low Risk  (09/23/2022)   Received from [facility]   Overall Financial Resource Strain (CARDIA)    Difficulty of Paying Living Expenses: Not very hard  Food Insecurity: No Food Insecurity (09/23/2022)   Received from [facility]   Hunger Vital Sign    Worried About Running Out of Food in the Last Year: Never true    Ran Out of Food in the Last Year: Never true  Transportation Needs: No Transportation Needs (09/23/2022)   Received from [facility] - Transportation    Lack of Transportation (Medical): No    Lack of Transportation (Non-Medical): No  Physical Activity: Unknown (09/23/2022)   Received from [facility]   Exercise Vital Sign    Days of Exercise per Week: 0 days    Minutes of Exercise per Session: Not on file  Stress: Stress Concern Present (09/23/2022)   Received from [facility] - Occupational Stress Questionnaire    Feeling of Stress : Very much  Social Connections: Somewhat Isolated (09/23/2022)   Received from [facility]   Social Network    How would you rate your social network (family, work, friends)?: Restricted participation with some degree of social isolation  Intimate Partner Violence: Not At Risk (02/11/2023)   Received from [facility]   HITS    Over the last 12 months how often did your partner physically hurt you?: 1    Over the last  12 months how often did your partner insult you or talk down to you?: 1    Over the last 12 months how often did your partner threaten you with physical harm?: 1    Over the last 12 months how often did your partner scream or curse at you?: 1     PHYSICAL EXAM  There were no vitals filed for this visit. There is no height or weight on file to calculate BMI.  Generalized: Well developed, in no acute distress  Cardiology: normal rate and rhythm, no murmur auscultated  Respiratory: clear to auscultation bilaterally    Neurological examination  Mentation: Alert oriented to time, place, history taking. Follows all commands speech and language fluent Cranial nerve II-XII: Pupils were equal round reactive to light. Extraocular movements were full, visual field were full on confrontational test. Facial sensation and strength were normal. Uvula tongue midline. Head turning and shoulder shrug  were normal and symmetric. Motor: The motor testing reveals 5 over 5 strength of all 4 extremities. Good symmetric motor tone is noted throughout.  Sensory: Sensory testing is intact to soft touch on all 4 extremities. No evidence of extinction is noted.  Coordination: Cerebellar testing reveals good finger-nose-finger and heel-to-shin bilaterally.  Gait and station: Gait is normal. Tandem gait is normal. Romberg is negative. No drift is seen.  Reflexes: Deep tendon reflexes are symmetric and normal bilaterally.    DIAGNOSTIC DATA (LABS, IMAGING, TESTING) - I reviewed patient records, labs, notes, testing and imaging myself where available.  Lab Results  Component Value Date   WBC 12.4 (H) 04/03/2021   HGB 10.9 (L) 04/03/2021   HCT 35.2 04/03/2021   MCV 80 04/03/2021   PLT 399 04/03/2021      Component Value Date/Time   NA 135 04/03/2021 1605   K 4.4 04/03/2021 1605   CL 100 04/03/2021 1605   CO2 20 04/03/2021 1605   GLUCOSE 74 04/03/2021 1605   BUN 11 04/03/2021 1605   CREATININE 0.69  04/03/2021 1605   CALCIUM 9.7 04/03/2021 1605   PROT 7.2 04/03/2021 1605   ALBUMIN 4.9 (H) 04/03/2021 1605   AST 13 04/03/2021 1605   ALT  15 04/03/2021 1605   ALKPHOS 76 04/03/2021 1605   BILITOT <0.2 04/03/2021 1605   No results found for: "CHOL", "HDL", "LDLCALC", "LDLDIRECT", "TRIG", "CHOLHDL" No results found for: "HGBA1C" No results found for: "VITAMINB12" Lab Results  Component Value Date   TSH 1.360 04/03/2021        No data to display               No data to display           ASSESSMENT AND PLAN  37 y.o. year old female  has a past medical history of Anxiety, Asthma, Depression, GERD (gastroesophageal reflux disease), Headache, History of COVID-19 (03/05/2021), Hypertension, Panic disorder, Papilledema, and PONV (postoperative nausea and vomiting). here with    No diagnosis found.  [name] ***.  Healthy lifestyle habits encouraged. *** will follow up with PCP as directed. *** will return to see me in ***, sooner if needed. *** verbalizes understanding and agreement with this plan.   No orders of the defined types were placed in this encounter.    No orders of the defined types were placed in this encounter.    Shawnie Dapper, MSN, FNP-C 03/17/2023, 1:38 PM  [facility] [address], [address] [place], [place] [address] [phone]

## 2023-03-18 ENCOUNTER — Ambulatory Visit: Payer: Medicaid Other | Admitting: Family Medicine

## 2023-03-18 DIAGNOSIS — G932 Benign intracranial hypertension: Secondary | ICD-10-CM

## 2023-04-20 ENCOUNTER — Other Ambulatory Visit: Payer: Self-pay | Admitting: Neurology

## 2023-05-04 ENCOUNTER — Encounter: Payer: Self-pay | Admitting: Neurology

## 2023-06-02 ENCOUNTER — Ambulatory Visit: Payer: Medicaid Other

## 2023-06-04 ENCOUNTER — Ambulatory Visit: Payer: Medicaid Other | Attending: Orthopaedic Surgery | Admitting: Physical Therapy

## 2023-07-19 ENCOUNTER — Other Ambulatory Visit: Payer: Self-pay | Admitting: Neurology

## 2023-08-04 NOTE — Patient Instructions (Incomplete)

## 2023-08-04 NOTE — Progress Notes (Unsigned)
No chief complaint on file.   HISTORY OF PRESENT ILLNESS:  08/04/23 ALL:  Judy Taylor is a 37 y.o. female here today for follow up for IIH. She was last seen by Dr Lucia Gaskins 02/2022 and doing well on Trokendi 100mg  at bedtime.    HISTORY (copied from Dr Trevor Mace previous note)  7/276/2023: She is doing well. She denies any vision changes or headaches, still on topiramate, vision is doing well, she saw Dr. Dione Booze in ophtho yesterday and he said she was improved, she has lost weight, no hearing changes.    Patient complains of symptoms per HPI as well as the following symptoms: none . Pertinent negatives and positives per HPI. 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 She was started on diamox and referred to Dr. Dione Booze for eye exam. Had side effects with Diamox, better on Topiramate.Opening pressure was 30. Headaches are better. Vision is better. She is going ot make an appointment for eyes. [facility]. Hearing changes gone. Headaches gone. No vision changes. [facility]ing on [facility].   04/26/2021: MRI/MRV brain: IMPRESSION: pesonally reviewed images and agree: This MRI of the brain with and without contrast shows the following: 1.   Reduced pituitary height and a normal sized sella turcica consistent with a partially empty sella and widened optic nerve studies.  These findings are nonspecific and could be incidental or due to elevated intracranial pressure. 2.   Brain parenchyma appears normal. 3.   No acute findings.  Normal enhancement pattern. IMPRESSION: This MR venogram of the intracranial veins and sinuses shows the following: 1.   Relative flow voids are noted at the junction of the transverse sinuses and sigmoid sinuses bilaterally.  This is a nonspecific finding but is common with elevated intracranial pressure as would be seen with idiopathic intracranial hypertension.  No evidence of  thrombosis in the superior sagittal or other sinuses. 2.   Asymmetry of the transverse and sigmoid sinuses bilaterally, more diminutive on the left.  Asymmetry is a normal variant.   Reviewe LP report, opening pressure elevated at 20   Patient complains of symptoms per HPI as well as the following symptoms: obesity . Pertinent negatives and positives per HPI. All others negative     HPI:  Judy Taylor is a 37 y.o. female here as requested by [facility]* for papilledema. Reviewed Dr. Laruth Bouchard notes: nerve photos show 270 degree of margin blurring, no vessel obscuration, OCT show symmetric edema OU stable from 3 months ago.   She was told she had swelling behind her eyes. She actually saw Dr. Pearlean Brownie in the past, papilledema was mentioned back then, MRI brain and MRV were normal, she did not have the lumbar puncture completed then her topiramate was started by psychiatry and she got better. She has tried to lose weight. Feels like a tiny person behind her eye, going on for years, sometimes in the back of her head, sometimes pointed towards the right side, 2-3x a week, lasts "a while" but can be dull all day long, can be severeshe has hearing changes. She is supposed to wear glasses, her vision is a little blurry. She snores, she is very tired, she falls asleep during the day. No other focal neurologic deficits, associated symptoms, inciting events or modifiable factors.   Reviewed notes, labs and imaging from outside physicians, which showed:   Reviewed Dr. Laruth Bouchard notes: nerve photos show  270 degree of margin blurring, no vessel obscuration, OCT show symmetric edema OU stable from 3 months ago.   MRI/MRV of the brain in 2015 were normal, rviewed imaging and agree   REVIEW OF SYSTEMS: Out of a complete 14 system review of symptoms, the patient complains only of the following symptoms, and all other reviewed systems are negative.   ALLERGIES: Allergies  Allergen Reactions    Bactrim [Sulfamethoxazole-Trimethoprim] Rash   Latex Rash   Wound Dressing Adhesive Rash     HOME MEDICATIONS: Outpatient Medications Prior to Visit  Medication Sig Dispense Refill   albuterol (VENTOLIN HFA) 108 (90 Base) MCG/ACT inhaler Inhale 2 puffs into the lungs every 6 (six) hours as needed for wheezing or shortness of breath.     Cetirizine HCl (ZYRTEC PO) Take 10 mg by mouth.     clonazePAM (KLONOPIN) 0.5 MG tablet Take 0.5 mg by mouth 2 (two) times daily as needed for anxiety.     fluticasone (FLONASE) 50 MCG/ACT nasal spray Place 1 spray into both nostrils as needed.   0   omeprazole (PRILOSEC) 40 MG capsule Take 40 mg by mouth daily.     sertraline (ZOLOFT) 100 MG tablet Take 100 mg by mouth daily.     Topiramate ER (TROKENDI XR) 100 MG CP24 Take 1 capsule by mouth at bedtime 90 capsule 0   No facility-administered medications prior to visit.     PAST MEDICAL HISTORY: Past Medical History:  Diagnosis Date   Anxiety    Asthma    Depression    GERD (gastroesophageal reflux disease)    Headache    History of COVID-19 03/05/2021   Hypertension    Panic disorder    Papilledema    PONV (postoperative nausea and vomiting)      PAST SURGICAL HISTORY: Past Surgical History:  Procedure Laterality Date   CARPAL TUNNEL RELEASE Right 03/26/2021   Procedure: RIGHT CARPAL TUNNEL RELEASE;  Surgeon: Cindee Salt, MD;  Location: [facility];  Service: Orthopedics;  Laterality: Right;   CESAREAN SECTION     CESAREAN SECTION     x 3   TUBAL LIGATION       FAMILY HISTORY: Family History  Problem Relation Age of Onset   High blood pressure Mother    Heart attack Maternal Grandmother    Dementia Paternal Grandmother      SOCIAL HISTORY: Social History   Socioeconomic History   Marital status: Married    Spouse name: Not on file   Number of children: 2   Years of education: 12th   Highest education level: Not on file  Occupational History    Occupation: N/A  Tobacco Use   Smoking status: Some Days    Types: Cigarettes   Smokeless tobacco: Never  Vaping Use   Vaping status: Never Used  Substance and Sexual Activity   Alcohol use: Not Currently   Drug use: No   Sexual activity: Yes    Birth control/protection: Surgical  Other Topics Concern   Not on file  Social History Narrative   ** Merged History Encounter **        Patient is married with 2 children. Patient is right handed Patient has hs education. Patient drinks around 5 sodas daily.   Social Drivers of Corporate investment banker Strain: Low Risk  (09/23/2022)   Received from [facility]   Overall Financial Resource Strain (CARDIA)    Difficulty of Paying Living Expenses: Not very hard  Food Insecurity: No Food Insecurity (09/23/2022)   Received from [facility]   Hunger Vital Sign    Worried About Running Out of Food in the Last Year: Never true    Ran Out of Food in the Last Year: Never true  Transportation Needs: No Transportation Needs (09/23/2022)   Received from [facility] - Transportation    Lack of Transportation (Medical): No    Lack of Transportation (Non-Medical): No  Physical Activity: Unknown (09/23/2022)   Received from [facility]   Exercise Vital Sign    Days of Exercise per Week: 0 days    Minutes of Exercise per Session: Not on file  Stress: Stress Concern Present (09/23/2022)   Received from [facility] - Occupational Stress Questionnaire    Feeling of Stress : Very much  Social Connections: Somewhat Isolated (09/23/2022)   Received from [facility]   Social Network    How would you rate your social network (family, work, friends)?: Restricted participation with some degree of social isolation  Intimate Partner Violence: Not At Risk (02/11/2023)   Received from [facility]   HITS    Over the last 12 months how often did your partner physically hurt you?: Never    Over the  last 12 months how often did your partner insult you or talk down to you?: Never    Over the last 12 months how often did your partner threaten you with physical harm?: Never    Over the last 12 months how often did your partner scream or curse at you?: Never     PHYSICAL EXAM  There were no vitals filed for this visit. There is no height or weight on file to calculate BMI.  Generalized: Well developed, in no acute distress  Cardiology: normal rate and rhythm, no murmur auscultated  Respiratory: clear to auscultation bilaterally    Neurological examination  Mentation: Alert oriented to time, place, history taking. Follows all commands speech and language fluent Cranial nerve II-XII: Pupils were equal round reactive to light. Extraocular movements were full, visual field were full on confrontational test. Facial sensation and strength were normal. Uvula tongue midline. Head turning and shoulder shrug  were normal and symmetric. Motor: The motor testing reveals 5 over 5 strength of all 4 extremities. Good symmetric motor tone is noted throughout.  Sensory: Sensory testing is intact to soft touch on all 4 extremities. No evidence of extinction is noted.  Coordination: Cerebellar testing reveals good finger-nose-finger and heel-to-shin bilaterally.  Gait and station: Gait is normal. Tandem gait is normal. Romberg is negative. No drift is seen.  Reflexes: Deep tendon reflexes are symmetric and normal bilaterally.    DIAGNOSTIC DATA (LABS, IMAGING, TESTING) - I reviewed patient records, labs, notes, testing and imaging myself where available.  Lab Results  Component Value Date   WBC 12.4 (H) 04/03/2021   HGB 10.9 (L) 04/03/2021   HCT 35.2 04/03/2021   MCV 80 04/03/2021   PLT 399 04/03/2021      Component Value Date/Time   NA 135 04/03/2021 1605   K 4.4 04/03/2021 1605   CL 100 04/03/2021 1605   CO2 20 04/03/2021 1605   GLUCOSE 74 04/03/2021 1605   BUN 11 04/03/2021 1605    CREATININE 0.69 04/03/2021 1605   CALCIUM 9.7 04/03/2021 1605   PROT 7.2 04/03/2021 1605   ALBUMIN 4.9 (H) 04/03/2021 1605   AST 13 04/03/2021 1605   ALT  15 04/03/2021 1605   ALKPHOS 76 04/03/2021 1605   BILITOT <0.2 04/03/2021 1605   No results found for: "CHOL", "HDL", "LDLCALC", "LDLDIRECT", "TRIG", "CHOLHDL" No results found for: "HGBA1C" No results found for: "VITAMINB12" Lab Results  Component Value Date   TSH 1.360 04/03/2021        No data to display               No data to display           ASSESSMENT AND PLAN  37 y.o. year old female  has a past medical history of Anxiety, Asthma, Depression, GERD (gastroesophageal reflux disease), Headache, History of COVID-19 (03/05/2021), Hypertension, Panic disorder, Papilledema, and PONV (postoperative nausea and vomiting). here with    No diagnosis found.  [name] ***.  Healthy lifestyle habits encouraged. *** will follow up with PCP as directed. *** will return to see me in ***, sooner if needed. *** verbalizes understanding and agreement with this plan.   No orders of the defined types were placed in this encounter.    No orders of the defined types were placed in this encounter.    Shawnie Dapper, MSN, FNP-C 08/04/2023, 3:06 PM  [facility] [address] [place], [place] [address] [phone]

## 2023-08-05 ENCOUNTER — Ambulatory Visit: Payer: Medicaid Other | Admitting: Family Medicine

## 2023-08-05 ENCOUNTER — Encounter: Payer: Self-pay | Admitting: Family Medicine

## 2023-08-05 VITALS — BP 129/89 | HR 76 | Ht <= 58 in | Wt 188.0 lb

## 2023-08-05 DIAGNOSIS — R29898 Other symptoms and signs involving the musculoskeletal system: Secondary | ICD-10-CM

## 2023-08-05 DIAGNOSIS — G932 Benign intracranial hypertension: Secondary | ICD-10-CM

## 2023-08-05 DIAGNOSIS — F419 Anxiety disorder, unspecified: Secondary | ICD-10-CM

## 2023-08-05 DIAGNOSIS — F32A Depression, unspecified: Secondary | ICD-10-CM

## 2023-08-05 DIAGNOSIS — G4719 Other hypersomnia: Secondary | ICD-10-CM | POA: Diagnosis not present

## 2023-08-05 DIAGNOSIS — G2581 Restless legs syndrome: Secondary | ICD-10-CM | POA: Diagnosis not present

## 2023-08-05 MED ORDER — TOPIRAMATE ER 100 MG PO CAP24: 1.0000 | ORAL_CAPSULE | Freq: Every day | ORAL | 3 refills | Status: DC

## 2024-02-08 NOTE — Progress Notes (Unsigned)
 No chief complaint on file.   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 She was last seen 07/2023 and reported headaches were well managed on topiramate . Since,   08/05/2023 ALL:  Judy Taylor is a 38 y.o. female here today for follow up for IIH. She was last seen by Dr Ines 02/2022 and doing well on Trokendi  100mg  at bedtime. She had sleep study with Dr Chalice that was normal.   She returns, today, reporting that she ran out of medication about a week or so ago. She notes more headaches and eye pressure over the past week. When taking topiramate  consistently, she reports headaches were well controlled. She has about 12-15 headache days a month. Most are dull, pressure headaches. No vision changes. She has not had a recent eye exam. She is having more trouble with aching of her legs. Worse at night. She was started on ropinirole per PCP and feels this helps. She is always tired. She doesn't have enough energy to take a shower at times. She feels that her balance is off at times. She feels hands are weak. She has history of CTS, s/p CTR right side. She follows with hand surgeon as needed. She has a history of anxiety and depression. Now taking sertraline 200mg  daily. She feels mood is fair. She lives with her husband and 5 children. Youngest son is defiant. She was previously with psychiatry and psychology but hasn't seen them recently. She is not currently exercising. She has history of b12 def, vit D def and anemia. She is not sure when labs were last checked. She is followed by PCP regularly.    HISTORY (copied from Dr Sharion previous note)  7/276/2023: She is doing well. She denies any vision changes or headaches, still on topiramate , vision is doing well, she saw Dr. Octavia in ophtho yesterday and he said she was improved, she has lost weight, no hearing changes.    Patient complains of symptoms per HPI as well as the following symptoms: none  . Pertinent negatives and positives per HPI. 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 She was started on diamox  and referred to Dr. Octavia for eye exam. Had side effects with Diamox , better on Topiramate .Opening pressure was 30. Headaches are better. Vision is better. She is going ot make an appointment for eyes. Referred to [facility]. Hearing changes gone. Headaches gone. No vision changes. Waiting on [facility].   04/26/2021: MRI/MRV brain: IMPRESSION: pesonally reviewed images and agree: This MRI of the brain with and without contrast shows the following: 1.   Reduced pituitary height and a normal sized sella turcica consistent with a partially empty sella and widened optic nerve studies.  These findings are nonspecific and could be incidental or due to elevated intracranial pressure. 2.   Brain parenchyma appears normal. 3.   No acute findings.  Normal enhancement pattern. IMPRESSION: This MR venogram of the intracranial veins and sinuses shows the following: 1.   Relative flow voids are noted at the junction of the transverse sinuses and sigmoid sinuses bilaterally.  This is a nonspecific finding but is common with elevated intracranial pressure as would be seen with idiopathic intracranial hypertension.  No evidence of thrombosis in the superior sagittal or other sinuses. 2.   Asymmetry of the transverse and sigmoid sinuses bilaterally, more diminutive on the left.  Asymmetry is a normal  variant.   Reviewe LP report, opening pressure elevated at 20   Patient complains of symptoms per HPI as well as the following symptoms: obesity . Pertinent negatives and positives per HPI. All others negative     HPI:  Judy Taylor is a 38 y.o. female here as requested by [facility]* for papilledema. Reviewed Dr. Milford notes: nerve photos show 270 degree of margin blurring, no vessel obscuration, OCT  show symmetric edema OU stable from 3 months ago.   She was told she had swelling behind her eyes. She actually saw Dr. Rosemarie in the past, papilledema was mentioned back then, MRI brain and MRV were normal, she did not have the lumbar puncture completed then her topiramate  was started by psychiatry and she got better. She has tried to lose weight. Feels like a tiny person behind her eye, going on for years, sometimes in the back of her head, sometimes pointed towards the right side, 2-3x a week, lasts a while but can be dull all day long, can be severeshe has hearing changes. She is supposed to wear glasses, her vision is a little blurry. She snores, she is very tired, she falls asleep during the day. No other focal neurologic deficits, associated symptoms, inciting events or modifiable factors.   Reviewed notes, labs and imaging from outside physicians, which showed:   Reviewed Dr. Milford notes: nerve photos show 270 degree of margin blurring, no vessel obscuration, OCT show symmetric edema OU stable from 3 months ago.   MRI/MRV of the brain in 2015 were normal, rviewed imaging and agree   REVIEW OF SYSTEMS: Out of a complete 14 system review of symptoms, the patient complains only of the following symptoms, fatigue, headaches, imbalance, hand weakness, R>L, anxiety, depression, and all other reviewed systems are negative.   ALLERGIES: Allergies  Allergen Reactions   Bactrim [Sulfamethoxazole-Trimethoprim] Rash   Latex Rash   Wound Dressing Adhesive Rash     HOME MEDICATIONS: Outpatient Medications Prior to Visit  Medication Sig Dispense Refill   albuterol (VENTOLIN HFA) 108 (90 Base) MCG/ACT inhaler Inhale 2 puffs into the lungs every 6 (six) hours as needed for wheezing or shortness of breath.     Cetirizine HCl (ZYRTEC PO) Take 10 mg by mouth.     clonazePAM (KLONOPIN) 0.5 MG tablet Take 0.5 mg by mouth 2 (two) times daily as needed for anxiety.     fluticasone (FLONASE) 50  MCG/ACT nasal spray Place 1 spray into both nostrils as needed.   0   omeprazole (PRILOSEC) 40 MG capsule Take 40 mg by mouth daily.     rOPINIRole (REQUIP) 0.25 MG tablet Take 0.25 mg by mouth 3 (three) times daily.     sertraline (ZOLOFT) 100 MG tablet Take 200 mg by mouth daily. 200mg      Topiramate  ER (TROKENDI  XR) 100 MG CP24 Take 1 capsule (100 mg total) by mouth at bedtime. 90 capsule 3   No facility-administered medications prior to visit.     PAST MEDICAL HISTORY: Past Medical History:  Diagnosis Date   Anxiety    Asthma    Depression    GERD (gastroesophageal reflux disease)    Headache    History of COVID-19 03/05/2021   Hypertension    Panic disorder    Papilledema    PONV (postoperative nausea and vomiting)      PAST SURGICAL HISTORY: Past Surgical History:  Procedure Laterality Date   CARPAL TUNNEL RELEASE Right 03/26/2021   Procedure:  RIGHT CARPAL TUNNEL RELEASE;  Surgeon: Murrell Kuba, MD;  Location: [facility];  Service: Orthopedics;  Laterality: Right;   CESAREAN SECTION     CESAREAN SECTION     x 3   TUBAL LIGATION       FAMILY HISTORY: Family History  Problem Relation Age of Onset   High blood pressure Mother    Heart attack Maternal Grandmother    Dementia Paternal Grandmother      SOCIAL HISTORY: Social History   Socioeconomic History   Marital status: Married    Spouse name: Not on file   Number of children: 2   Years of education: 12th   Highest education level: Not on file  Occupational History   Occupation: N/A  Tobacco Use   Smoking status: Some Days    Types: Cigarettes   Smokeless tobacco: Never  Vaping Use   Vaping status: Never Used  Substance and Sexual Activity   Alcohol use: Not Currently   Drug use: No   Sexual activity: Yes    Birth control/protection: Surgical  Other Topics Concern   Not on file  Social History Narrative   ** Merged History Encounter **        Patient is married with 2  children. Patient is right handed Patient has hs education. Patient drinks around 5 sodas daily.   Social Drivers of Corporate investment banker Strain: Low Risk  (09/23/2022)   Received from [facility]   Overall Financial Resource Strain (CARDIA)    Difficulty of Paying Living Expenses: Not very hard  Food Insecurity: No Food Insecurity (09/23/2022)   Received from [facility]   Hunger Vital Sign    Worried About Running Out of Food in the Last Year: Never true    Ran Out of Food in the Last Year: Never true  Transportation Needs: No Transportation Needs (09/23/2022)   Received from [facility] - Transportation    Lack of Transportation (Medical): No    Lack of Transportation (Non-Medical): No  Physical Activity: Unknown (09/23/2022)   Received from [facility]   Exercise Vital Sign    Days of Exercise per Week: 0 days    Minutes of Exercise per Session: Not on file  Stress: Stress Concern Present (09/23/2022)   Received from [facility] - Occupational Stress Questionnaire    Feeling of Stress : Very much  Social Connections: Somewhat Isolated (09/23/2022)   Received from [facility]   Social Network    How would you rate your social network (family, work, friends)?: Restricted participation with some degree of social isolation  Intimate Partner Violence: Not At Risk (02/11/2023)   Received from [facility]   HITS    Over the last 12 months how often did your partner physically hurt you?: Never    Over the last 12 months how often did your partner insult you or talk down to you?: Never    Over the last 12 months how often did your partner threaten you with physical harm?: Never    Over the last 12 months how often did your partner scream or curse at you?: Never     PHYSICAL EXAM  There were no vitals filed for this visit.  There is no height or weight on file to calculate BMI.  Generalized: Well developed, in no  acute distress  Cardiology: normal rate and rhythm, no murmur auscultated  Respiratory: clear to auscultation  bilaterally    Neurological examination  Mentation: Alert oriented to time, place, history taking. Follows all commands speech and language fluent Cranial nerve II-XII: Pupils were equal round reactive to light. Extraocular movements were full, visual field were full on confrontational test. Facial sensation and strength were normal. Uvula tongue midline. Head turning and shoulder shrug  were normal and symmetric. Motor: The motor testing reveals 5 over 5 strength of all 4 extremities. Good symmetric motor tone is noted throughout.  Sensory: Sensory testing is intact to soft touch on all 4 extremities. No evidence of extinction is noted.  Coordination: Cerebellar testing reveals good finger-nose-finger and heel-to-shin bilaterally.  Gait and station: Gait is normal.    DIAGNOSTIC DATA (LABS, IMAGING, TESTING) - I reviewed patient records, labs, notes, testing and imaging myself where available.  Lab Results  Component Value Date   WBC 12.4 (H) 04/03/2021   HGB 10.9 (L) 04/03/2021   HCT 35.2 04/03/2021   MCV 80 04/03/2021   PLT 399 04/03/2021      Component Value Date/Time   NA 135 04/03/2021 1605   K 4.4 04/03/2021 1605   CL 100 04/03/2021 1605   CO2 20 04/03/2021 1605   GLUCOSE 74 04/03/2021 1605   BUN 11 04/03/2021 1605   CREATININE 0.69 04/03/2021 1605   CALCIUM 9.7 04/03/2021 1605   PROT 7.2 04/03/2021 1605   ALBUMIN 4.9 (H) 04/03/2021 1605   AST 13 04/03/2021 1605   ALT 15 04/03/2021 1605   ALKPHOS 76 04/03/2021 1605   BILITOT <0.2 04/03/2021 1605   No results found for: CHOL, HDL, LDLCALC, LDLDIRECT, TRIG, CHOLHDL No results found for: [id number] No results found for: VITAMINB12 Lab Results  Component Value Date   TSH 1.360 04/03/2021        No data to display               No data to display           ASSESSMENT AND  PLAN  38 y.o. year old female  has a past medical history of Anxiety, Asthma, Depression, GERD (gastroesophageal reflux disease), Headache, History of COVID-19 (03/05/2021), Hypertension, Panic disorder, Papilledema, and PONV (postoperative nausea and vomiting). here with    No diagnosis found.  Avice Funchess Shinsato reports headaches are improved on topiramate . We will resume 100mg  XR daily dosing. She was encouraged to update eye exam. I have encouraged her to schedule an appt with her PCP for updated labs if not assessed recently. We have discussed trial of increased exercise and consideration of reestablishing with psychiatry/psychology. We may adjust headache management in future if needed. Healthy lifestyle habits encouraged. She will follow up with PCP as directed. She will return to see me in 6 months, sooner if needed. She verbalizes understanding and agreement with this plan.   No orders of the defined types were placed in this encounter.    No orders of the defined types were placed in this encounter.    Greig Forbes, MSN, FNP-C 02/08/2024, 1:04 PM  [facility] [address], [address] [place], [place] [address] [phone]

## 2024-02-08 NOTE — Patient Instructions (Signed)

## 2024-02-09 ENCOUNTER — Ambulatory Visit: Payer: Medicaid Other | Admitting: Family Medicine

## 2024-02-09 ENCOUNTER — Encounter: Payer: Self-pay | Admitting: Family Medicine

## 2024-02-09 VITALS — BP 142/84 | HR 81 | Ht <= 58 in | Wt 192.6 lb

## 2024-02-09 DIAGNOSIS — G932 Benign intracranial hypertension: Secondary | ICD-10-CM

## 2024-02-09 DIAGNOSIS — R29898 Other symptoms and signs involving the musculoskeletal system: Secondary | ICD-10-CM

## 2024-02-09 DIAGNOSIS — G8929 Other chronic pain: Secondary | ICD-10-CM | POA: Diagnosis not present

## 2024-02-09 DIAGNOSIS — M545 Low back pain, unspecified: Secondary | ICD-10-CM | POA: Diagnosis not present

## 2024-02-09 MED ORDER — PREDNISONE 10 MG (21) PO TBPK: ORAL_TABLET | ORAL | 0 refills | Status: AC

## 2024-02-09 MED ORDER — TOPIRAMATE ER 100 MG PO CAP24: 1.0000 | ORAL_CAPSULE | Freq: Every day | ORAL | 3 refills | Status: AC

## 2025-02-13 ENCOUNTER — Ambulatory Visit: Admitting: Family Medicine
